# Patient Record
Sex: Female | Born: 1995
Health system: Southern US, Community
[De-identification: ages and names within clinical notes are randomized; demographics above are authoritative.]

## PROBLEM LIST (undated history)

## (undated) DIAGNOSIS — R519 Headache, unspecified: Secondary | ICD-10-CM

## (undated) DIAGNOSIS — F909 Attention-deficit hyperactivity disorder, unspecified type: Secondary | ICD-10-CM

## (undated) DIAGNOSIS — F419 Anxiety disorder, unspecified: Secondary | ICD-10-CM

## (undated) DIAGNOSIS — R51 Headache: Secondary | ICD-10-CM

## (undated) DIAGNOSIS — F32A Depression, unspecified: Secondary | ICD-10-CM

## (undated) DIAGNOSIS — F319 Bipolar disorder, unspecified: Secondary | ICD-10-CM

## (undated) DIAGNOSIS — F429 Obsessive-compulsive disorder, unspecified: Secondary | ICD-10-CM

## (undated) DIAGNOSIS — E785 Hyperlipidemia, unspecified: Secondary | ICD-10-CM

## (undated) DIAGNOSIS — F609 Personality disorder, unspecified: Secondary | ICD-10-CM

## (undated) DIAGNOSIS — K219 Gastro-esophageal reflux disease without esophagitis: Secondary | ICD-10-CM

## (undated) DIAGNOSIS — F329 Major depressive disorder, single episode, unspecified: Secondary | ICD-10-CM

## (undated) HISTORY — DX: Attention-deficit hyperactivity disorder, unspecified type: F90.9

## (undated) HISTORY — DX: Personality disorder, unspecified: F60.9

## (undated) HISTORY — DX: Hyperlipidemia, unspecified: E78.5

## (undated) HISTORY — DX: Anxiety disorder, unspecified: F41.9

---
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
## 2016-02-01 HISTORY — PX: CHOLECYSTECTOMY: SHX55

## 2016-02-08 ENCOUNTER — Emergency Department
Admission: EM | Admit: 2016-02-08 | Discharge: 2016-02-08 | Disposition: A | Discharge status: Home / Self Care | Payer: BLUE CROSS/BLUE SHIELD | Attending: Emergency Medicine | Admitting: Emergency Medicine

## 2016-02-08 ENCOUNTER — Emergency Department: Payer: BLUE CROSS/BLUE SHIELD

## 2016-02-08 ENCOUNTER — Encounter: Payer: Self-pay | Admitting: Emergency Medicine

## 2016-02-08 DIAGNOSIS — K8020 Calculus of gallbladder without cholecystitis without obstruction: Secondary | ICD-10-CM | POA: Diagnosis not present

## 2016-02-08 DIAGNOSIS — Z79899 Other long term (current) drug therapy: Secondary | ICD-10-CM | POA: Diagnosis not present

## 2016-02-08 DIAGNOSIS — R109 Unspecified abdominal pain: Secondary | ICD-10-CM | POA: Diagnosis present

## 2016-02-08 DIAGNOSIS — K8050 Calculus of bile duct without cholangitis or cholecystitis without obstruction: Secondary | ICD-10-CM

## 2016-02-08 DIAGNOSIS — R1011 Right upper quadrant pain: Secondary | ICD-10-CM

## 2016-02-08 DIAGNOSIS — Z3202 Encounter for pregnancy test, result negative: Secondary | ICD-10-CM | POA: Diagnosis not present

## 2016-02-08 HISTORY — DX: Bipolar disorder, unspecified: F31.9

## 2016-02-08 LAB — CBC
HCT: 42.3 % (ref 35.0–47.0)
HEMOGLOBIN: 14.4 g/dL (ref 12.0–16.0)
MCH: 30.2 pg (ref 26.0–34.0)
MCHC: 34 g/dL (ref 32.0–36.0)
MCV: 88.7 fL (ref 80.0–100.0)
Platelets: 322 10*3/uL (ref 150–440)
RBC: 4.76 MIL/uL (ref 3.80–5.20)
RDW: 13.1 % (ref 11.5–14.5)
WBC: 7.2 10*3/uL (ref 3.6–11.0)

## 2016-02-08 LAB — COMPREHENSIVE METABOLIC PANEL
ALT: 17 U/L (ref 14–54)
ANION GAP: 5 (ref 5–15)
AST: 15 U/L (ref 15–41)
Albumin: 4.2 g/dL (ref 3.5–5.0)
Alkaline Phosphatase: 89 U/L (ref 38–126)
BUN: 14 mg/dL (ref 6–20)
CHLORIDE: 103 mmol/L (ref 101–111)
CO2: 29 mmol/L (ref 22–32)
Calcium: 9.3 mg/dL (ref 8.9–10.3)
Creatinine, Ser: 0.91 mg/dL (ref 0.44–1.00)
GFR calc non Af Amer: >60 mL/min (ref >60)
Glucose, Bld: 101 mg/dL — ABNORMAL HIGH (ref 65–99)
Potassium: 4.1 mmol/L (ref 3.5–5.1)
SODIUM: 137 mmol/L (ref 135–145)
Total Bilirubin: 0.6 mg/dL (ref 0.3–1.2)
Total Protein: 7.2 g/dL (ref 6.5–8.1)

## 2016-02-08 LAB — LIPASE, BLOOD: Lipase: 18 U/L (ref 11–51)

## 2016-02-08 LAB — URINALYSIS COMPLETE WITH MICROSCOPIC (ARMC ONLY)
Bacteria, UA: NONE SEEN
Bilirubin Urine: NEGATIVE
Glucose, UA: NEGATIVE mg/dL
HGB URINE DIPSTICK: NEGATIVE
KETONES UR: NEGATIVE mg/dL
Leukocytes, UA: NEGATIVE
Nitrite: NEGATIVE
PROTEIN: NEGATIVE mg/dL
Specific Gravity, Urine: 1.025 (ref 1.005–1.030)
pH: 7 (ref 5.0–8.0)

## 2016-02-08 LAB — POCT PREGNANCY, URINE: Preg Test, Ur: NEGATIVE

## 2016-02-08 MED ORDER — OXYCODONE-ACETAMINOPHEN 5-325 MG PO TABS: 2.0000 | ORAL_TABLET | Freq: Four times a day (QID) | ORAL | Status: DC | PRN

## 2016-02-08 MED ORDER — FAMOTIDINE 20 MG PO TABS: 20.0000 mg | ORAL_TABLET | Freq: Every day | ORAL | Status: DC

## 2016-02-08 NOTE — ED Provider Notes (Signed)
IMPRESSION: Several large gallstones with associated gallbladder wall thickening, which may be due to incomplete distention of the gallbladder or in the appropriate clinical setting may represent an acute cholecystitis.  Normal caliber of the extrahepatic common bile duct.  Normal appearance of the liver.  Patient's symptoms are consistent with biliary colic. She will receive pain medication and Gen. surgery outpatient referral. She does not have laboratory findings consistent with cholecystitis. Her exam is benign at this time.  Emily Filbert, MD 02/08/16 [phone]

## 2016-02-08 NOTE — Discharge Instructions (Signed)
Biliary Colic °Biliary colic is a pain in the upper abdomen. The pain: °· Is usually felt on the right side of the abdomen, but it may also be felt in the center of the abdomen, just below the breastbone (sternum). °· May spread back toward the right shoulder blade. °· May be steady or irregular. °· May be accompanied by nausea and vomiting. °Most of the time, the pain goes away in 1-5 hours. After the most intense pain passes, the abdomen may continue to ache mildly for about 24 hours. °Biliary colic is caused by a blockage in the bile duct. The bile duct is a pathway that carries bile--a liquid that helps to digest fats--from the gallbladder to the small intestine. Biliary colic usually occurs after eating, when the digestive system demands bile. The pain develops when muscle cells contract forcefully to try to move the blockage so that bile can get by. °HOME CARE INSTRUCTIONS °· Take medicines only as directed by your health care provider. °· Drink enough fluid to keep your urine clear or pale yellow. °· Avoid fatty, greasy, and fried foods. These kinds of foods increase your body's demand for bile. °· Avoid any foods that make your pain worse. °· Avoid overeating. °· Avoid having a large meal after fasting. °SEEK MEDICAL CARE IF: °· You develop a fever. °· Your pain gets worse. °· You vomit. °· You develop nausea that prevents you from eating and drinking. °SEEK IMMEDIATE MEDICAL CARE IF: °· You suddenly develop a fever and shaking chills. °· You develop a yellowish discoloration (jaundice) of: °¨ Skin. °¨ Whites of the eyes. °¨ Mucous membranes. °· You have continuous or severe pain that is not relieved with medicines. °· You have nausea and vomiting that is not relieved with medicines. °· You develop dizziness or you faint. °  °This information is not intended to replace advice given to you by your health care provider. Make sure you discuss any questions you have with your health care provider. °  °Document  Released: 05/20/2006 Document Revised: 05/03/2015 Document Reviewed: 09/28/2014 °Elsevier Interactive Patient Education ©2016 [employer] ° °

## 2016-02-08 NOTE — ED Notes (Signed)
Pt presents to ED with mid abd aching. Pt states the discomfort "just sits there". She reports "it occurs all day so it gives different feelings depending on whats going on throughout the day". Worse in the morning and at night. Seen by urgent care last week after several days of vomiting and was prescribed Zofran with no relief. Pt denies diarrhea or fever currently.

## 2016-02-08 NOTE — ED Provider Notes (Signed)
[facility] Emergency Department Provider Note  ____________________________________________  Time seen: 6:20 AM   I have reviewed the triage vital signs and the nursing notes.   HISTORY  Chief Complaint Abdominal Pain    HPI Jessica MCCLARY is a 21 y.o. female presents with mid abdominal pain times approximately 6 months is worsen after eating. Patient states that pain is worse in the morning and at night. Patient denies any diarrhea but does admit to nausea and vomiting accompanying the abdominal pain. Patient states that she was seen by urgent care and prescribed Zofran however she does not know the etiology of her pain at this time. Patient also admits to a family history of gallstones in both her mother and grandmother.    Past Medical History  Diagnosis Date  . Depression   . Bipolar disorder (HCC)     There are no active problems to display for this patient.   History reviewed. No pertinent past surgical history.  Current Outpatient Rx  Name  Route  Sig  Dispense  Refill  . ABILIFY 30 MG tablet   Oral   Take 1 tablet by mouth daily.      0     Dispense as written.   . hydrOXYzine (ATARAX/VISTARIL) 25 MG tablet   Oral   Take 1 tablet by mouth 2 (two) times daily.      1   . ondansetron (ZOFRAN-ODT) 8 MG disintegrating tablet   Oral   Take 1 tablet by mouth every 8 (eight) hours as needed.      0   . TRI-PREVIFEM 0.18/0.215/0.25 MG-35 MCG tablet   Oral   Take 1 tablet by mouth daily.      11     Dispense as written.     Allergies No known allergies No family history on file.  Social History Social History  Substance Use Topics  . Smoking status: Never Smoker   . Smokeless tobacco: Never Used  . Alcohol Use: No    Review of Systems  Constitutional: Negative for fever. Eyes: Negative for visual changes. ENT: Negative for sore throat. Cardiovascular: Negative for chest pain. Respiratory: Negative for  shortness of breath. Gastrointestinal: Negative for abdominal pain, vomiting and diarrhea. Genitourinary: Negative for dysuria. Musculoskeletal: Negative for back pain. Skin: Negative for rash. Neurological: Negative for headaches, focal weakness or numbness.   10-point ROS otherwise negative.  ____________________________________________   PHYSICAL EXAM:  VITAL SIGNS: ED Triage Vitals  Enc Vitals Group     BP 02/08/16 0348 138/69 mmHg     Pulse Rate 02/08/16 0348 69     Resp 02/08/16 0348 18     Temp 02/08/16 0348 98 F (36.7 C)     Temp Source 02/08/16 0348 Oral     SpO2 02/08/16 0348 99 %     Weight 02/08/16 0348 155 lb (70.308 kg)     Height 02/08/16 0348  (1.651 m)     Head Cir --      Peak Flow --      Pain Score 02/08/16 0412 6     Pain Loc --      Pain Edu? --      Excl. in GC? --      Constitutional: Alert and oriented. Well appearing and in no distress. Eyes: Conjunctivae are normal. PERRL. Normal extraocular movements. ENT   Head: Normocephalic and atraumatic.   Nose: No congestion/rhinnorhea.   Mouth/Throat: Mucous membranes are moist.   Neck: No  stridor. Hematological/Lymphatic/Immunilogical: No cervical lymphadenopathy. Cardiovascular: Normal rate, regular rhythm. Normal and symmetric distal pulses are present in all extremities. No murmurs, rubs, or gallops. Respiratory: Normal respiratory effort without tachypnea nor retractions. Breath sounds are clear and equal bilaterally. No wheezes/rales/rhonchi. Gastrointestinal: Right upper quadrant tenderness to palpation. No distention. There is no CVA tenderness. Genitourinary: deferred Musculoskeletal: Nontender with normal range of motion in all extremities. No joint effusions.  No lower extremity tenderness nor edema. Neurologic:  Normal speech and language. No gross focal neurologic deficits are appreciated. Speech is normal.  Skin:  Skin is warm, dry and intact. No rash  noted. Psychiatric: Mood and affect are normal. Speech and behavior are normal. Patient exhibits appropriate insight and judgment.  ____________________________________________    LABS (pertinent positives/negatives)  Labs Reviewed  COMPREHENSIVE METABOLIC PANEL - Abnormal; Notable for the following:    Glucose, Bld 101 (*)    All other components within normal limits  URINALYSIS COMPLETEWITH MICROSCOPIC ([facility] ONLY) - Abnormal; Notable for the following:    Color, Urine YELLOW (*)    APPearance CLEAR (*)    Squamous Epithelial / LPF 0-5 (*)    All other components within normal limits  LIPASE, BLOOD  CBC  POCT PREGNANCY, URINE      RADIOLOGY  US Abdomen Limited RUQ (Final result) Result time: 02/08/16 08:15:43   Final result by Rad Results In Interface (02/08/16 08:15:43)   Narrative:   CLINICAL DATA: Right upper quadrant pain for 2 months.  EXAM: US ABDOMEN LIMITED - RIGHT UPPER QUADRANT  COMPARISON: None.  FINDINGS: Gallbladder:  The gallbladder is incompletely distended. There are multiple large mobile gallstones, the largest of which measures 1.9 cm in long-axis. There is an associated gallbladder wall thickening, measuring up to 5.3 mm. No pericholecystic fluid is seen. Sonographic Murphy's sign was reported as negative.  Common bile duct:  Diameter: Normal, measuring 3.1 to 5 mm.  Liver:  No focal lesion identified. Within normal limits in parenchymal echogenicity.  IMPRESSION: Several large gallstones with associated gallbladder wall thickening, which may be due to incomplete distention of the gallbladder or in the appropriate clinical setting may represent an acute cholecystitis.  Normal caliber of the extrahepatic common bile duct.  Normal appearance of the liver.   Electronically Signed By: Ted Mcalpine M.D. On: 02/08/2016 08:15         INITIAL IMPRESSION / ASSESSMENT AND PLAN / ED COURSE  Pertinent labs & imaging  results that were available during my care of the patient were reviewed by me and considered in my medical decision making (see chart for details).  History of physical exam concern for possible cholelithiasis versus cholecystitis as such ultrasound ordered patient care transferred to Dr. Mayford Knife  ____________________________________________   FINAL CLINICAL IMPRESSION(S) / ED DIAGNOSES  Final diagnoses:  RUQ pain  Biliary colic      Darci Current, MD 02/10/16 [phone]

## 2016-02-13 ENCOUNTER — Encounter: Payer: Self-pay | Admitting: Emergency Medicine

## 2016-02-13 ENCOUNTER — Emergency Department
Admission: EM | Admit: 2016-02-13 | Discharge: 2016-02-13 | Disposition: A | Discharge status: Home / Self Care | Payer: BLUE CROSS/BLUE SHIELD | Attending: Emergency Medicine | Admitting: Emergency Medicine

## 2016-02-13 DIAGNOSIS — Z79899 Other long term (current) drug therapy: Secondary | ICD-10-CM | POA: Insufficient documentation

## 2016-02-13 DIAGNOSIS — Z760 Encounter for issue of repeat prescription: Secondary | ICD-10-CM | POA: Insufficient documentation

## 2016-02-13 DIAGNOSIS — R1030 Lower abdominal pain, unspecified: Secondary | ICD-10-CM | POA: Diagnosis not present

## 2016-02-13 MED ORDER — OXYCODONE-ACETAMINOPHEN 5-325 MG PO TABS
1.0000 | ORAL_TABLET | ORAL | Status: DC | PRN
Start: 2016-02-13 — End: 2016-02-16

## 2016-02-13 NOTE — ED Notes (Signed)
Pt reports dx with gallstones last wk. Still having vomiting when she eats.  Pt refused more labs, states she has a f/u apt on 2/16 and was told if she needed more meds before her apt to return here.

## 2016-02-13 NOTE — ED Provider Notes (Signed)
[facility] Emergency Department Provider Note  ____________________________________________  Time seen: Approximately 11:24 AM  I have reviewed the triage vital signs and the nursing notes.   HISTORY  Chief Complaint Medication refill   HPI Jessica Gates is a 21 y.o. female was seen in the ED 02/08/16 for emesis and diagnosed with gallstones. She presents today regarding medication refill to have until cholecystectomy on February 16th. She is currently taking Pepcid 20 mg 1 tab po qd and percocet 5-325 mg 2 tabs po every 6 hours as needed. Since her visit on the 8th she has not had any changes in symptoms although she has not been able to eat for the past 2 days. She complains of lower abdominal aching that slightly radiates laterally. Pain is worse with eating and worse in the morning and at night. She ranks her pain a 6-8/10.   Past Medical History  Diagnosis Date  . Depression   . Bipolar disorder (HCC)     There are no active problems to display for this patient.   History reviewed. No pertinent past surgical history.  Current Outpatient Rx  Name  Route  Sig  Dispense  Refill  . ABILIFY 30 MG tablet   Oral   Take 1 tablet by mouth daily.      0     Dispense as written.   . famotidine (PEPCID) 20 MG tablet   Oral   Take 1 tablet (20 mg total) by mouth daily.   30 tablet   1   . hydrOXYzine (ATARAX/VISTARIL) 25 MG tablet   Oral   Take 1 tablet by mouth 2 (two) times daily.      1   . ondansetron (ZOFRAN-ODT) 8 MG disintegrating tablet   Oral   Take 1 tablet by mouth every 8 (eight) hours as needed.      0   . oxyCODONE-acetaminophen (PERCOCET) 5-325 MG tablet   Oral   Take 2 tablets by mouth every 6 (six) hours as needed for moderate pain or severe pain.   30 tablet   0   . oxyCODONE-acetaminophen (ROXICET) 5-325 MG tablet   Oral   Take 1 tablet by mouth every 4 (four) hours as needed for severe pain.   30 tablet   0   .  TRI-PREVIFEM 0.18/0.215/0.25 MG-35 MCG tablet   Oral   Take 1 tablet by mouth daily.      11     Dispense as written.     Allergies Review of patient's allergies indicates no known allergies.  History reviewed. No pertinent family history.  Social History Social History  Substance Use Topics  . Smoking status: Never Smoker   . Smokeless tobacco: Never Used  . Alcohol Use: No    Review of Systems Constitutional: No fever/chills Gastrointestinal: See above  10-point ROS otherwise negative.  ____________________________________________   PHYSICAL EXAM:  VITAL SIGNS: ED Triage Vitals  Enc Vitals Group     BP 02/13/16 1038 131/86 mmHg     Pulse Rate 02/13/16 1038 84     Resp 02/13/16 1038 18     Temp 02/13/16 1038 98.2 F (36.8 C)     Temp Source 02/13/16 1038 Oral     SpO2 02/13/16 1038 98 %     Weight 02/13/16 1038 165 lb (74.844 kg)     Height 02/13/16 1038  (1.651 m)     Head Cir --      Peak Flow --  Pain Score 02/13/16 1039 5     Pain Loc --      Pain Edu? --      Excl. in GC? --     Constitutional: Alert and oriented. Well appearing and in no acute distress. Eyes: Conjunctivae are normal.  Head: Atraumatic. Hematological/Lymphatic/Immunilogical: No cervical lymphadenopathy. Cardiovascular: Normal rate, regular rhythm. Grossly normal heart sounds.  Respiratory: Normal respiratory effort.  No retractions. Lungs CTAB. Gastrointestinal: Soft and tender in RLQ, LUQ and LLQ. No distention. No abdominal bruits. Neurologic:  Normal speech and language. No gross focal neurologic deficits are appreciated. No gait instability. Skin:  Skin is warm, dry and intact. No rash noted. Psychiatric: Mood and affect are normal. Speech and behavior are normal.  ____________________________________________   LABS (all labs ordered are listed, but only abnormal results are displayed)  Labs Reviewed - No data to  display ____________________________________________  EKG   ____________________________________________  RADIOLOGY   ____________________________________________   PROCEDURES  Procedure(s) performed: None  Critical Care performed: No  ____________________________________________   INITIAL IMPRESSION / ASSESSMENT AND PLAN / ED COURSE  Pertinent labs & imaging results that were available during my care of the patient were reviewed by me and considered in my medical decision making (see chart for details).  Medication refill for cholelithiasis. Is pending surgery in 3 days. Patient prescription for Percocet for 3 days. ____________________________________________   FINAL CLINICAL IMPRESSION(S) / ED DIAGNOSES  Final diagnoses:  Encounter for medication refill      Joni Reining, PA-C 02/13/16 1142  Rockne Menghini, MD 02/13/16 1528

## 2016-02-13 NOTE — Discharge Instructions (Signed)
Medicine Refill at the Emergency Department  We have refilled your medicine today, but it is best for you to get refills through your primary health care provider's office. In the future, please plan ahead so you do not need to get refills from the emergency department.  If the medicine we refilled was a maintenance medicine, you may have received only enough to get you by until you are able to see your regular health care provider.     This information is not intended to replace advice given to you by your health care provider. Make sure you discuss any questions you have with your health care provider.     Document Released: 04/04/2004 Document Revised: 01/07/2015 Document Reviewed: 03/26/2014  Elsevier Interactive Patient Education 2016 [employer]

## 2016-02-15 ENCOUNTER — Encounter: Payer: Self-pay | Admitting: General Surgery

## 2016-02-16 ENCOUNTER — Ambulatory Visit (INDEPENDENT_AMBULATORY_CARE_PROVIDER_SITE_OTHER): Payer: BLUE CROSS/BLUE SHIELD | Admitting: General Surgery

## 2016-02-16 ENCOUNTER — Encounter: Payer: Self-pay | Admitting: General Surgery

## 2016-02-16 VITALS — BP 110/76 | HR 69 | Temp 97.8°F | Ht 65.0 in | Wt 163.0 lb

## 2016-02-16 DIAGNOSIS — K8020 Calculus of gallbladder without cholecystitis without obstruction: Secondary | ICD-10-CM | POA: Insufficient documentation

## 2016-02-16 MED ORDER — OXYCODONE-ACETAMINOPHEN 5-325 MG PO TABS: 1.0000 | ORAL_TABLET | ORAL | Status: DC | PRN

## 2016-02-16 NOTE — Progress Notes (Signed)
Patient ID: Jessica Gates, female   DOB: 1995/01/19, 21 y.o.   MRN: [id number]  CC: ABDOMINAL PAIN  HPI Jessica Gates is a 21 y.o. female  Presents to clinic for evaluation of right sided abdominal pain. Patient is been seen in the emergency department twice for this or the last month. Patient states that the pain is worse in the mornings and evenings but is worsened by eating. It doesn't matter what she eats it worsens her pain. She was seen in emergency department and found to have cholelithiasis without cholecystitis. Her labs were all normal and she was discharged home with outpatient follow-up with [place] and pain medications. Patient states she has had some nausea and vomiting with this but none in the last several days. She also had diarrhea at the onset of the pain but none in the last week. She denies any fevers, chills, chest pain, shortness of breath. She does state that they oral pain medications while reviewed through her day while she is awaiting surgery.  HPI  Past Medical History  Diagnosis Date  . Depression   . Bipolar disorder (HCC)   . Anxiety   . Personality disorder   . ADHD (attention deficit hyperactivity disorder)     History reviewed. No pertinent past surgical history.  Family History  Problem Relation Age of Onset  . Cholelithiasis Mother   . Cancer Mother 30    kidney  . Mental illness Mother   . Appendicitis Father   . Cancer Maternal Grandfather     lung and throat  . Diabetes Paternal Grandmother     Social History Social History  Substance Use Topics  . Smoking status: Never Smoker   . Smokeless tobacco: Never Used  . Alcohol Use: No    No Known Allergies  Current Outpatient Prescriptions  Medication Sig Dispense Refill  . ABILIFY 30 MG tablet Take 1 tablet by mouth daily.  0  . famotidine (PEPCID) 20 MG tablet Take 1 tablet (20 mg total) by mouth daily. 30 tablet 1  . HYDROcodone-acetaminophen (NORCO/VICODIN) 5-325 MG tablet Take 1  tablet by mouth every 6 (six) hours as needed for moderate pain.    . hydrOXYzine (ATARAX/VISTARIL) 25 MG tablet Take 1 tablet by mouth 2 (two) times daily.  1  . ondansetron (ZOFRAN-ODT) 8 MG disintegrating tablet Take 1 tablet by mouth every 8 (eight) hours as needed.  0  . TRI-PREVIFEM 0.18/0.215/0.25 MG-35 MCG tablet Take 1 tablet by mouth daily.  11  . oxyCODONE-acetaminophen (ROXICET) 5-325 MG tablet Take 1 tablet by mouth every 4 (four) hours as needed for severe pain. 20 tablet 0   No current facility-administered medications for this visit.     Review of Systems A  Multi-point review of systems was asked and was negative except for the  Findings documented in the history of present illness  Physical Exam Blood pressure 110/76, pulse 69, temperature 97.8 F (36.6 C), temperature source Oral, height  (1.651 m), weight 73.936 kg (163 lb), last menstrual period 01/23/2016. CONSTITUTIONAL:  No acute distress. EYES: Pupils are equal, round, and reactive to light, Sclera are non-icteric. EARS, NOSE, MOUTH AND THROAT: The oropharynx is clear. The oral mucosa is pink and moist. Hearing is intact to voice. LYMPH NODES:  Lymph nodes in the neck are normal. RESPIRATORY:  Lungs are clear. There is normal respiratory effort, with equal breath sounds bilaterally, and without pathologic use of accessory muscles. CARDIOVASCULAR: Heart is regular without murmurs, gallops, or  rubs. GI: The abdomen is soft,  Minimally tender in the right upper quadrant without any rebound or Murphy sign, and nondistended. There are no palpable masses. There is no hepatosplenomegaly. There are normal bowel sounds in all quadrants. GU: Rectal deferred.   MUSCULOSKELETAL: Normal muscle strength and tone. No cyanosis or edema.   SKIN: Turgor is good and there are no pathologic skin lesions or ulcers. NEUROLOGIC: Motor and sensation is grossly normal. Cranial nerves are grossly intact. PSYCH:  Oriented to person,  place and time. Affect is normal.  Data Reviewed  images and labs reviewed. Ultrasound does show gallstones with possible gallbladder wall thickening but without any prior cholecystic fluid or any ductal dilatation. All of her labs are within normal limits. I have personally reviewed the patient's imaging, laboratory findings and medical records.    Assessment     symptomatic cholelithiasis     Plan     21 year old female with symptomatic cholelithiasis. I discussed the procedure in detail.  The patient was given Agricultural engineer.  We discussed the risks and benefits of a laparoscopic cholecystectomy and possible cholangiogram including, but not limited to bleeding, infection, injury to surrounding structures such as the intestine or liver, bile leak, retained gallstones, need to convert to an open procedure, prolonged diarrhea, blood clots such as  DVT, common bile duct injury, anesthesia risks, and possible need for additional procedures.  The likelihood of improvement in symptoms and return to the patient's normal status is good. We discussed the typical post-operative recovery course.    The patient and her mother both voiced understanding and desire for surgery soon as possible. Discussed that the earliest possible operative dates her with one of my partners and would be next week.  Plan for surgery next week with either Dr. Excell Seltzer or Dr. Everlene Farrier depending on availability.      Time spent with the patient was 45 minutes, with more than 50% of the time spent in face-to-face education, counseling and care coordination.     Ricarda Frame, MD FACS General Surgeon 02/16/2016, 2:27 PM

## 2016-02-17 ENCOUNTER — Telehealth: Payer: Self-pay | Admitting: Surgery

## 2016-02-17 NOTE — Telephone Encounter (Signed)
Patience called back and I advised her of her surgery information.

## 2016-02-17 NOTE — Telephone Encounter (Signed)
I have called Pt to advise her of pre op date/time and sx date, no answer. i have left a detailed message.  Sx: 02/22/16 with Dr Ludwig Clarks cholecystectomy.  Pre op: 02/20/16 between 1-5pm--Phone pre op.

## 2016-02-20 ENCOUNTER — Encounter: Payer: Self-pay | Admitting: *Deleted

## 2016-02-20 ENCOUNTER — Emergency Department
Admission: EM | Admit: 2016-02-20 | Discharge: 2016-02-20 | Disposition: A | Discharge status: Home / Self Care | Payer: BLUE CROSS/BLUE SHIELD | Attending: Emergency Medicine | Admitting: Emergency Medicine

## 2016-02-20 ENCOUNTER — Encounter: Payer: Self-pay | Admitting: Emergency Medicine

## 2016-02-20 ENCOUNTER — Other Ambulatory Visit: Payer: BLUE CROSS/BLUE SHIELD

## 2016-02-20 DIAGNOSIS — K8020 Calculus of gallbladder without cholecystitis without obstruction: Secondary | ICD-10-CM | POA: Diagnosis not present

## 2016-02-20 DIAGNOSIS — R1031 Right lower quadrant pain: Secondary | ICD-10-CM | POA: Diagnosis present

## 2016-02-20 LAB — CBC
HEMATOCRIT: 40.5 % (ref 35.0–47.0)
Hemoglobin: 13.5 g/dL (ref 12.0–16.0)
MCH: 29.7 pg (ref 26.0–34.0)
MCHC: 33.4 g/dL (ref 32.0–36.0)
MCV: 88.8 fL (ref 80.0–100.0)
Platelets: 207 10*3/uL (ref 150–440)
RBC: 4.56 MIL/uL (ref 3.80–5.20)
RDW: 13.1 % (ref 11.5–14.5)
WBC: 7.3 10*3/uL (ref 3.6–11.0)

## 2016-02-20 LAB — LIPASE, BLOOD: Lipase: 17 U/L (ref 11–51)

## 2016-02-20 LAB — COMPREHENSIVE METABOLIC PANEL
ALT: 18 U/L (ref 14–54)
ANION GAP: 6 (ref 5–15)
AST: 18 U/L (ref 15–41)
Albumin: 4.5 g/dL (ref 3.5–5.0)
Alkaline Phosphatase: 80 U/L (ref 38–126)
BILIRUBIN TOTAL: 0.5 mg/dL (ref 0.3–1.2)
BUN: 11 mg/dL (ref 6–20)
CO2: 26 mmol/L (ref 22–32)
Calcium: 9.3 mg/dL (ref 8.9–10.3)
Chloride: 107 mmol/L (ref 101–111)
Creatinine, Ser: 0.58 mg/dL (ref 0.44–1.00)
GFR calc Af Amer: >60 mL/min (ref >60)
Glucose, Bld: 84 mg/dL (ref 65–99)
POTASSIUM: 3.4 mmol/L — AB (ref 3.5–5.1)
Sodium: 139 mmol/L (ref 135–145)
TOTAL PROTEIN: 7.1 g/dL (ref 6.5–8.1)

## 2016-02-20 NOTE — Patient Instructions (Signed)
  Your procedure is scheduled on: 02-22-16 Report to [facility] 2ND FLOOR To find out your arrival time please call [phone] between 1PM - 3PM on 02-21-16  Remember: Instructions that are not followed completely may result in serious medical risk, up to and including death, or upon the discretion of your surgeon and anesthesiologist your surgery may need to be rescheduled.    _X___ 1. Do not eat food or drink liquids after midnight. No gum chewing or hard candies.     _X___ 2. No Alcohol for 24 hours before or after surgery.   ____ 3. Bring all medications with you on the day of surgery if instructed.    ____ 4. Notify your doctor if there is any change in your medical condition     (cold, fever, infections).     Do not wear jewelry, make-up, hairpins, clips or nail polish.  Do not wear lotions, powders, or perfumes. You may wear deodorant.  Do not shave 48 hours prior to surgery. Men may shave face and neck.  Do not bring valuables to the hospital.    [facility] is not responsible for any belongings or valuables.               Contacts, dentures or bridgework may not be worn into surgery.  Leave your suitcase in the car. After surgery it may be brought to your room.  For patients admitted to the hospital, discharge time is determined by your treatment team.   Patients discharged the day of surgery will not be allowed to drive home.   Please read over the following fact sheets that you were given:      _X___ Take these medicines the morning of surgery with A SIP OF WATER:    1. ABILIFY  2.  PEPCID  3. TAKE AN EXTRA PEPCID ON Tuesday NIGHT BEFORE BED  4.  5.  6.  ____ Fleet Enema (as directed)   ____ Use CHG Soap as directed  ____ Use inhalers on the day of surgery  ____ Stop metformin 2 days prior to surgery    ____ Take 1/2 of usual insulin dose the night before surgery and none on the morning of surgery.   ____ Stop  Coumadin/Plavix/aspirin-N/A  ____ Stop Anti-inflammatories-NO NSAIDS OR ASA PRODUCTS-PERCOCET OK TO CONTINUE   ____ Stop supplements until after surgery.    ____ Bring C-Pap to the hospital.

## 2016-02-20 NOTE — ED Notes (Signed)
Pt arrived to the ED accompanied by her mother for complaints of RLQ abdominal pain secondary to gall stones. Pt states that she has been seen in the ED 3 times for the same and has surgery scheduled for 02/22/16 and was told by the surgeon that if she was hurting to come to the ED to do surgery. Pt is AOx4 in no apparent distress.

## 2016-02-22 ENCOUNTER — Ambulatory Visit
Admission: RE | Admit: 2016-02-22 | Discharge: 2016-02-22 | Disposition: A | Discharge status: Home / Self Care | Payer: BLUE CROSS/BLUE SHIELD | Source: Ambulatory Visit | Attending: Surgery | Admitting: Surgery

## 2016-02-22 ENCOUNTER — Encounter
Admission: RE | Disposition: A | Discharge status: Home / Self Care | Payer: Self-pay | Source: Ambulatory Visit | Attending: Surgery

## 2016-02-22 ENCOUNTER — Ambulatory Visit: Payer: BLUE CROSS/BLUE SHIELD | Admitting: Certified Registered"

## 2016-02-22 ENCOUNTER — Encounter: Payer: Self-pay | Admitting: *Deleted

## 2016-02-22 DIAGNOSIS — Z79899 Other long term (current) drug therapy: Secondary | ICD-10-CM | POA: Diagnosis not present

## 2016-02-22 DIAGNOSIS — F319 Bipolar disorder, unspecified: Secondary | ICD-10-CM | POA: Diagnosis not present

## 2016-02-22 DIAGNOSIS — Z8051 Family history of malignant neoplasm of kidney: Secondary | ICD-10-CM | POA: Insufficient documentation

## 2016-02-22 DIAGNOSIS — F419 Anxiety disorder, unspecified: Secondary | ICD-10-CM | POA: Insufficient documentation

## 2016-02-22 DIAGNOSIS — K8050 Calculus of bile duct without cholangitis or cholecystitis without obstruction: Secondary | ICD-10-CM | POA: Insufficient documentation

## 2016-02-22 DIAGNOSIS — F909 Attention-deficit hyperactivity disorder, unspecified type: Secondary | ICD-10-CM | POA: Diagnosis not present

## 2016-02-22 DIAGNOSIS — Z801 Family history of malignant neoplasm of trachea, bronchus and lung: Secondary | ICD-10-CM | POA: Insufficient documentation

## 2016-02-22 DIAGNOSIS — K8010 Calculus of gallbladder with chronic cholecystitis without obstruction: Secondary | ICD-10-CM | POA: Diagnosis not present

## 2016-02-22 DIAGNOSIS — Z833 Family history of diabetes mellitus: Secondary | ICD-10-CM | POA: Insufficient documentation

## 2016-02-22 DIAGNOSIS — Z800 Family history of malignant neoplasm of digestive organs: Secondary | ICD-10-CM | POA: Diagnosis not present

## 2016-02-22 DIAGNOSIS — F329 Major depressive disorder, single episode, unspecified: Secondary | ICD-10-CM | POA: Diagnosis not present

## 2016-02-22 DIAGNOSIS — F609 Personality disorder, unspecified: Secondary | ICD-10-CM | POA: Diagnosis not present

## 2016-02-22 DIAGNOSIS — G43909 Migraine, unspecified, not intractable, without status migrainosus: Secondary | ICD-10-CM | POA: Insufficient documentation

## 2016-02-22 DIAGNOSIS — Z818 Family history of other mental and behavioral disorders: Secondary | ICD-10-CM | POA: Insufficient documentation

## 2016-02-22 DIAGNOSIS — Z8379 Family history of other diseases of the digestive system: Secondary | ICD-10-CM | POA: Diagnosis not present

## 2016-02-22 DIAGNOSIS — K219 Gastro-esophageal reflux disease without esophagitis: Secondary | ICD-10-CM | POA: Diagnosis not present

## 2016-02-22 HISTORY — DX: Gastro-esophageal reflux disease without esophagitis: K21.9

## 2016-02-22 HISTORY — DX: Headache: R51

## 2016-02-22 HISTORY — DX: Headache, unspecified: R51.9

## 2016-02-22 HISTORY — PX: CHOLECYSTECTOMY: SHX55

## 2016-02-22 LAB — POCT PREGNANCY, URINE: PREG TEST UR: NEGATIVE

## 2016-02-22 SURGERY — LAPAROSCOPIC CHOLECYSTECTOMY WITH INTRAOPERATIVE CHOLANGIOGRAM
Anesthesia: General

## 2016-02-22 MED ORDER — FENTANYL CITRATE (PF) 100 MCG/2ML IJ SOLN
INTRAMUSCULAR | Status: AC
  Administered 2016-02-22: 25 ug via INTRAVENOUS
  Filled 2016-02-22: qty 2

## 2016-02-22 MED ORDER — LACTATED RINGERS IV SOLN
INTRAVENOUS | Status: DC
  Administered 2016-02-22: 10:00:00 via INTRAVENOUS

## 2016-02-22 MED ORDER — OXYCODONE-ACETAMINOPHEN 5-325 MG PO TABS
1.0000 | ORAL_TABLET | Freq: Once | ORAL | Status: AC
  Administered 2016-02-22: 1 via ORAL

## 2016-02-22 MED ORDER — PROPOFOL 10 MG/ML IV BOLUS
INTRAVENOUS | Status: DC | PRN
  Administered 2016-02-22: 200 mg via INTRAVENOUS

## 2016-02-22 MED ORDER — GLYCOPYRROLATE 0.2 MG/ML IJ SOLN
INTRAMUSCULAR | Status: DC | PRN
  Administered 2016-02-22: 0.6 mg via INTRAVENOUS

## 2016-02-22 MED ORDER — ROCURONIUM BROMIDE 100 MG/10ML IV SOLN
INTRAVENOUS | Status: DC | PRN
  Administered 2016-02-22: 35 mg via INTRAVENOUS

## 2016-02-22 MED ORDER — FENTANYL CITRATE (PF) 100 MCG/2ML IJ SOLN
INTRAMUSCULAR | Status: DC | PRN
  Administered 2016-02-22 (×2): 50 ug via INTRAVENOUS
  Administered 2016-02-22: 100 ug via INTRAVENOUS
  Administered 2016-02-22: 50 ug via INTRAVENOUS

## 2016-02-22 MED ORDER — LIDOCAINE HCL (CARDIAC) 20 MG/ML IV SOLN
INTRAVENOUS | Status: DC | PRN
  Administered 2016-02-22: 50 mg via INTRAVENOUS

## 2016-02-22 MED ORDER — DEXAMETHASONE SODIUM PHOSPHATE 4 MG/ML IJ SOLN
INTRAMUSCULAR | Status: DC | PRN
  Administered 2016-02-22: 5 mg via INTRAVENOUS

## 2016-02-22 MED ORDER — PROMETHAZINE HCL 25 MG/ML IJ SOLN: 6.2500 mg | INTRAMUSCULAR | Status: DC | PRN

## 2016-02-22 MED ORDER — BUPIVACAINE-EPINEPHRINE (PF) 0.25% -1:200000 IJ SOLN
INTRAMUSCULAR | Status: AC
  Filled 2016-02-22: qty 30

## 2016-02-22 MED ORDER — CHLORHEXIDINE GLUCONATE 4 % EX LIQD: 1.0000 "application " | Freq: Once | CUTANEOUS | Status: DC

## 2016-02-22 MED ORDER — MIDAZOLAM HCL 2 MG/2ML IJ SOLN
INTRAMUSCULAR | Status: DC | PRN
Start: 2016-02-22 — End: 2016-02-22
  Administered 2016-02-22: 2 mg via INTRAVENOUS

## 2016-02-22 MED ORDER — BUPIVACAINE-EPINEPHRINE (PF) 0.25% -1:200000 IJ SOLN
INTRAMUSCULAR | Status: DC | PRN
  Administered 2016-02-22: 30 mL via PERINEURAL

## 2016-02-22 MED ORDER — NEOSTIGMINE METHYLSULFATE 10 MG/10ML IV SOLN
INTRAVENOUS | Status: DC | PRN
  Administered 2016-02-22: 4 mg via INTRAVENOUS

## 2016-02-22 MED ORDER — ONDANSETRON HCL 4 MG/2ML IJ SOLN
INTRAMUSCULAR | Status: DC | PRN
  Administered 2016-02-22: 4 mg via INTRAVENOUS

## 2016-02-22 MED ORDER — CEFOXITIN SODIUM 1 G IV SOLR
1.0000 g | INTRAVENOUS | Status: DC
  Filled 2016-02-22: qty 1

## 2016-02-22 MED ORDER — OXYCODONE-ACETAMINOPHEN 5-325 MG PO TABS
ORAL_TABLET | ORAL | Status: AC
Start: 2016-02-22 — End: 2016-02-22
  Administered 2016-02-22: 1 via ORAL
  Filled 2016-02-22: qty 1

## 2016-02-22 MED ORDER — FENTANYL CITRATE (PF) 100 MCG/2ML IJ SOLN
25.0000 ug | INTRAMUSCULAR | Status: AC | PRN
  Administered 2016-02-22 (×6): 25 ug via INTRAVENOUS

## 2016-02-22 SURGICAL SUPPLY — 43 items
ADHESIVE MASTISOL STRL (MISCELLANEOUS) ×3 IMPLANT
APPLIER CLIP ROT 10 11.4 M/L (STAPLE) ×3
BLADE SURG SZ11 CARB STEEL (BLADE) ×3 IMPLANT
CANISTER SUCT 1200ML W/VALVE (MISCELLANEOUS) ×3 IMPLANT
CATH CHOLANGI 4FR 420404F (CATHETERS) IMPLANT
CHLORAPREP W/TINT 26ML (MISCELLANEOUS) ×3 IMPLANT
CLIP APPLIE ROT 10 11.4 M/L (STAPLE) ×1 IMPLANT
CLOSURE WOUND 1/2 X4 (GAUZE/BANDAGES/DRESSINGS) ×1
CONRAY 60ML FOR OR (MISCELLANEOUS) IMPLANT
DRAPE C-ARM XRAY 36X54 (DRAPES) IMPLANT
ELECT REM PT RETURN 9FT ADLT (ELECTROSURGICAL) ×3
ELECTRODE REM PT RTRN 9FT ADLT (ELECTROSURGICAL) ×1 IMPLANT
ENDOPOUCH RETRIEVER 10 (MISCELLANEOUS) ×3 IMPLANT
GAUZE SPONGE NON-WVN 2X2 STRL (MISCELLANEOUS) ×3 IMPLANT
GLOVE BIO SURGEON STRL SZ8 (GLOVE) ×18 IMPLANT
GOWN STRL REUS W/ TWL LRG LVL3 (GOWN DISPOSABLE) ×3 IMPLANT
GOWN STRL REUS W/TWL LRG LVL3 (GOWN DISPOSABLE) ×6
IRRIGATION STRYKERFLOW (MISCELLANEOUS) ×1 IMPLANT
IRRIGATOR STRYKERFLOW (MISCELLANEOUS) ×3
IV CATH ANGIO 12GX3 LT BLUE (NEEDLE) IMPLANT
IV NS 1000ML (IV SOLUTION) ×2
IV NS 1000ML BAXH (IV SOLUTION) ×1 IMPLANT
JACKSON PRATT 10 (INSTRUMENTS) IMPLANT
KIT RM TURNOVER STRD PROC AR (KITS) ×3 IMPLANT
LABEL OR SOLS (LABEL) ×3 IMPLANT
NDL SAFETY 22GX1.5 (NEEDLE) ×3 IMPLANT
NEEDLE VERESS 14GA 120MM (NEEDLE) ×3 IMPLANT
NS IRRIG 500ML POUR BTL (IV SOLUTION) ×3 IMPLANT
PACK LAP CHOLECYSTECTOMY (MISCELLANEOUS) ×3 IMPLANT
SCISSORS METZENBAUM CVD 33 (INSTRUMENTS) ×3 IMPLANT
SLEEVE ENDOPATH XCEL 5M (ENDOMECHANICALS) ×6 IMPLANT
SPONGE EXCIL AMD DRAIN 4X4 6P (MISCELLANEOUS) IMPLANT
SPONGE LAP 18X18 5 PK (GAUZE/BANDAGES/DRESSINGS) ×3 IMPLANT
SPONGE VERSALON 2X2 STRL (MISCELLANEOUS) ×6
STRIP CLOSURE SKIN 1/2X4 (GAUZE/BANDAGES/DRESSINGS) ×2 IMPLANT
SUT MNCRL 4-0 (SUTURE) ×4
SUT MNCRL 4-0 27XMFL (SUTURE) ×2
SUT VICRYL 0 AB UR-6 (SUTURE) ×3 IMPLANT
SUTURE MNCRL 4-0 27XMF (SUTURE) ×2 IMPLANT
SYR 20CC LL (SYRINGE) ×3 IMPLANT
TROCAR XCEL NON-BLD 11X100MML (ENDOMECHANICALS) ×3 IMPLANT
TROCAR XCEL NON-BLD 5MMX100MML (ENDOMECHANICALS) ×3 IMPLANT
TUBING INSUFFLATOR HI FLOW (MISCELLANEOUS) ×3 IMPLANT

## 2016-02-22 NOTE — Transfer of Care (Signed)
Immediate Anesthesia Transfer of Care Note  Patient: Jessica Gates  Procedure(s) Performed: Procedure(s): LAPAROSCOPIC CHOLECYSTECTOMY WITH INTRAOPERATIVE CHOLANGIOGRAM (N/A)  Patient Location: PACU  Anesthesia Type:General  Level of Consciousness: sedated  Airway & Oxygen Therapy: Patient Spontanous Breathing and Patient connected to face mask oxygen  Post-op Assessment: Report given to RN  Post vital signs: Reviewed  Last Vitals:  Filed Vitals:   02/22/16 0908 02/22/16 1101  BP: 123/66 130/84  Pulse: 81   Temp: 36.6 C 36.1 C  Resp: 18 13    Complications: No apparent anesthesia complications

## 2016-02-22 NOTE — Progress Notes (Signed)
Preoperative Review   Patient is met in the preoperative holding area. The history is reviewed in the chart and with the patient. I personally reviewed the options and rationale as well as the risks of this procedure that have been previously discussed with the patient. All questions asked by the patient and/or family were answered to their satisfaction.  I personally reviewed the patient's chart and discussed her with Dr. Adonis Huguenin and reviewed the risks in detail myself. No family was present  Patient agrees to proceed with this procedure at this time.  Florene Glen M.D. FACS

## 2016-02-22 NOTE — Anesthesia Postprocedure Evaluation (Signed)
Anesthesia Post Note  Patient: Jessica Gates  Procedure(s) Performed: Procedure(s) (LRB): LAPAROSCOPIC CHOLECYSTECTOMY WITH INTRAOPERATIVE CHOLANGIOGRAM (N/A)  Patient location during evaluation: PACU Anesthesia Type: General Level of consciousness: awake and alert Pain management: pain level controlled Vital Signs Assessment: post-procedure vital signs reviewed and stable Respiratory status: spontaneous breathing and respiratory function stable Cardiovascular status: stable Anesthetic complications: no    Last Vitals:  Filed Vitals:   02/22/16 0908 02/22/16 1101  BP: 123/66 130/84  Pulse: 81   Temp: 36.6 C 36.1 C  Resp: 18 13    Last Pain:  Filed Vitals:   02/22/16 1104  PainSc: 3                  KEPHART,WILLIAM K

## 2016-02-22 NOTE — Anesthesia Preprocedure Evaluation (Signed)
Anesthesia Evaluation  Patient identified by MRN, date of birth, ID band Patient awake    Reviewed: Allergy & Precautions, H&P , NPO status , Patient's Chart, lab work & pertinent test results, reviewed documented beta blocker date and time   Airway Mallampati: I  TM Distance: >3 FB Neck ROM: full    Dental no notable dental hx. (+) Teeth Intact   Pulmonary neg pulmonary ROS,    Pulmonary exam normal breath sounds clear to auscultation       Cardiovascular Exercise Tolerance: Good negative cardio ROS Normal cardiovascular exam Rhythm:regular Rate:Normal     Neuro/Psych PSYCHIATRIC DISORDERS (Bipolar and ADHD) negative neurological ROS     GI/Hepatic Neg liver ROS, GERD  ,  Endo/Other  negative endocrine ROS  Renal/GU negative Renal ROS  negative genitourinary   Musculoskeletal   Abdominal   Peds  Hematology negative hematology ROS (+)   Anesthesia Other Findings Past Medical History:   Depression                                                   Bipolar disorder (HCC)                                       Anxiety                                                      Personality disorder                                         ADHD (attention deficit hyperactivity disorder)              GERD (gastroesophageal reflux disease)                       Headache                                                       Comment:MIGRAINES   Reproductive/Obstetrics negative OB ROS                             Anesthesia Physical Anesthesia Plan  ASA: I  Anesthesia Plan: General   Post-op Pain Management:    Induction:   Airway Management Planned:   Additional Equipment:   Intra-op Plan:   Post-operative Plan:   Informed Consent: I have reviewed the patients History and Physical, chart, labs and discussed the procedure including the risks, benefits and alternatives for the proposed  anesthesia with the patient or authorized representative who has indicated his/her understanding and acceptance.   Dental Advisory Given  Plan Discussed with: Anesthesiologist, CRNA and Surgeon  Anesthesia Plan Comments:         Anesthesia Quick Evaluation

## 2016-02-22 NOTE — Op Note (Signed)
Laparoscopic Cholecystectomy  Pre-operative Diagnosis: Biliary colic  Post-operative Diagnosis: Same  Procedure: Lap or scopic cholecystectomy  Surgeon: Adah Salvage. Excell Seltzer, MD FACS  Anesthesia: Gen. with endotracheal tube  Assistant: PA student  Procedure Details  The patient was seen again in the Holding Room. The benefits, complications, treatment options, and expected outcomes were discussed with the patient. The risks of bleeding, infection, recurrence of symptoms, failure to resolve symptoms, bile duct damage, bile duct leak, retained common bile duct stone, bowel injury, any of which could require further surgery and/or ERCP, stent, or papillotomy were reviewed with the patient. The likelihood of improving the patient's symptoms with return to their baseline status is good.  The patient and/or family concurred with the proposed plan, giving informed consent.  The patient was taken to Operating Room, identified as Jessica Gates and the procedure verified as Laparoscopic Cholecystectomy.  A Time Out was held and the above information confirmed.  Prior to the induction of general anesthesia, antibiotic prophylaxis was administered. VTE prophylaxis was in place. General endotracheal anesthesia was then administered and tolerated well. After the induction, the abdomen was prepped with Chloraprep and draped in the sterile fashion. The patient was positioned in the supine position.  Local anesthetic  was injected into the skin near the umbilicus and an incision made. The Veress needle was placed. Pneumoperitoneum was then created with CO2 and tolerated well without any adverse changes in the patient's vital signs. A 5mm port was placed in the periumbilical position and the abdominal cavity was explored.  Two 5-mm ports were placed in the right upper quadrant and a 12 mm epigastric port was placed all under direct vision. All skin incisions  were infiltrated with a local anesthetic agent before  making the incision and placing the trocars.   The patient was positioned  in reverse Trendelenburg, tilted slightly to the patient's left.  The gallbladder was identified, the fundus grasped and retracted cephalad. Adhesions were lysed bluntly. The infundibulum was grasped and retracted laterally, exposing the peritoneum overlying the triangle of Calot. This was then divided and exposed in a blunt fashion. A critical view of the cystic duct and cystic artery was obtained.  The cystic duct was clearly identified and bluntly dissected.   The cystic duct gallbladder junction was well identified the cystic artery was doubly clipped and divided this allowed for good visualization of the cystic duct as it entered the infundibulum of the gallbladder here it was doubly clipped and divided.  The gallbladder was taken from the gallbladder fossa in a retrograde fashion with the electrocautery. The gallbladder was removed and placed in an Endocatch bag. The liver bed was irrigated and inspected. Hemostasis was achieved with the electrocautery. Copious irrigation was utilized and was repeatedly aspirated until clear.  The gallbladder and Endocatch sac were then removed through the epigastric port site.    Inspection of the right upper quadrant was performed. No bleeding, bile duct injury or leak, or bowel injury was noted. Pneumoperitoneum was released.  The epigastric port site was closed with figure-of-eight 0 Vicryl sutures. 4-0 subcuticular Monocryl was used to close the skin. Steristrips and Mastisol and sterile dressings were  applied.  The patient was then extubated and brought to the recovery room in stable condition. Sponge, lap, and needle counts were correct at closure and at the conclusion of the case.   Findings: Large gallstones with chronic Cholecystitis   Estimated Blood Loss: Minimal         Drains:  None         Specimens: Gallbladder           Complications: none               Kenzlei Runions  E. Excell Seltzer, MD, FACS

## 2016-02-22 NOTE — Anesthesia Procedure Notes (Signed)
Procedure Name: Intubation Performed by: Lynnsey Barbara Pre-anesthesia Checklist: Patient identified, Patient being monitored, Timeout performed, Emergency Drugs available and Suction available Patient Re-evaluated:Patient Re-evaluated prior to inductionOxygen Delivery Method: Circle system utilized Preoxygenation: Pre-oxygenation with 100% oxygen Intubation Type: IV induction Ventilation: Mask ventilation without difficulty Laryngoscope Size: Miller and 2 Grade View: Grade I Tube type: Oral Tube size: 7.0 mm Number of attempts: 1 Placement Confirmation: ETT inserted through vocal cords under direct vision,  positive ETCO2 and breath sounds checked- equal and bilateral Secured at: 21 cm Tube secured with: Tape Dental Injury: Teeth and Oropharynx as per pre-operative assessment        

## 2016-02-22 NOTE — Discharge Instructions (Signed)
Remove dressing in 24 hours. °May shower in 24 hours. °Leave paper strips in place. °Resume all home medications. °Follow-up with Dr. Cooper in 10 days. ° °AMBULATORY SURGERY  °DISCHARGE INSTRUCTIONS ° ° °1) The drugs that you were given will stay in your system until tomorrow so for the next 24 hours you should not: ° °A) Drive an automobile °B) Make any legal decisions °C) Drink any alcoholic beverage ° ° °2) You may resume regular meals tomorrow.  Today it is better to start with liquids and gradually work up to solid foods. ° °You may eat anything you prefer, but it is better to start with liquids, then soup and crackers, and gradually work up to solid foods. ° ° °3) Please notify your doctor immediately if you have any unusual bleeding, trouble breathing, redness and pain at the surgery site, drainage, fever, or pain not relieved by medication. ° ° ° °4) Additional Instructions: ° ° ° ° ° ° ° °Please contact your physician with any problems or Same Day Surgery at [phone], Monday through Friday 6 am to 4 pm, or [place] [facility] number at [phone]. °

## 2016-02-23 LAB — SURGICAL PATHOLOGY

## 2016-02-27 ENCOUNTER — Telehealth: Payer: Self-pay

## 2016-02-27 NOTE — Telephone Encounter (Signed)
Patient called in at this time to see if she could move her appointment up. She states that she is having Shortness of Breath and Chest Pains at this time. I informed her she will need to be seen in the Emergency Room. She is on her way over to the hospital at this time.

## 2016-03-05 ENCOUNTER — Ambulatory Visit: Payer: BLUE CROSS/BLUE SHIELD | Admitting: Surgery

## 2016-03-06 ENCOUNTER — Encounter: Payer: Self-pay | Admitting: Surgery

## 2016-03-07 ENCOUNTER — Ambulatory Visit
Admission: RE | Admit: 2016-03-07 | Discharge: 2016-03-07 | Disposition: A | Discharge status: Home / Self Care | Payer: BLUE CROSS/BLUE SHIELD | Source: Ambulatory Visit | Attending: Surgery | Admitting: Surgery

## 2016-03-07 ENCOUNTER — Ambulatory Visit (INDEPENDENT_AMBULATORY_CARE_PROVIDER_SITE_OTHER): Payer: BLUE CROSS/BLUE SHIELD | Admitting: Surgery

## 2016-03-07 ENCOUNTER — Encounter: Payer: Self-pay | Admitting: Surgery

## 2016-03-07 VITALS — BP 131/84 | HR 69 | Temp 98.2°F | Ht 65.0 in | Wt 164.6 lb

## 2016-03-07 DIAGNOSIS — R0602 Shortness of breath: Secondary | ICD-10-CM | POA: Diagnosis not present

## 2016-03-07 DIAGNOSIS — K8020 Calculus of gallbladder without cholecystitis without obstruction: Secondary | ICD-10-CM

## 2016-03-07 NOTE — Patient Instructions (Addendum)
We will send you for a Chest X-ray today at the Hospital. We will call you with the results as soon as they become available.  Please see your Note for Work provided today.

## 2016-03-07 NOTE — Progress Notes (Signed)
Outpatient postop visit  03/07/2016  Jessica Gates is an 21 y.o. female.    Procedure:  Laparoscopic cholecystectomy    HPI:  's patient status post laparoscopic cholecystectomy for symptoms of biliary colic.  She complains of shortness of breath on exertion when walking that goes away spontaneously when resting. She also has nausea but no emesis has alternated diarrhea and constipation denies jaundice or acholic stools and denies fevers or chills  Medications reviewed.    Physical Exam:  LMP 01/23/2016 (Exact Date)    PE:  Chest clear to auscultation cardiac is regular rate and rhythm  Patient appears very comfortable and her respiratory rate is completely normal at this point at rest  No icterus no jaundice soft nontender abdomen wounds healing well without erythema or drainage calves are nontender and nonedematous    Assessment/Plan:   shortness of breath with exertion will obtain chest x-ray today but the patient appears very comfortable and does not appear ill at all she wants to go back to work but will reserve return to work until after chest x-ray completed otherwise she'll follow up on an as-needed basis  Jessica Hawichard E Sheyna Pettibone, MD, FACS

## 2016-03-12 ENCOUNTER — Telehealth: Payer: Self-pay | Admitting: Surgery

## 2016-03-12 NOTE — Telephone Encounter (Signed)
Patient would like results from her xray on 03/07/16. Please call and advise

## 2016-03-12 NOTE — Telephone Encounter (Signed)
Returned phone call to patient at this time. Explained that chest x-ray was negative from Friday.  Patient would like a return to work note to go back on Friday, 03/16/16. She works at [employer] and this needs to be faxed to [place].

## 2016-03-13 NOTE — Telephone Encounter (Signed)
Faxed disability note at this time.

## 2016-05-31 ENCOUNTER — Telehealth: Payer: Self-pay | Admitting: Surgery

## 2016-05-31 NOTE — Telephone Encounter (Signed)
Patient states that she is having pain in her upper abdomen for the last month when she awakes in the morning and then when she puts anything on her stomach. After surgery, she was not feeling this pain until 1 month ago. Patient denies nausea, vomiting, constipation. Is having some diarrhea as well. I suggested that this is most likely GI related as she was doing much better for the 3 months following her surgery and is now exhibiting symptoms. Suggested that she call and be seen by her PCP. She verbalizes understanding of this.

## 2016-05-31 NOTE — Telephone Encounter (Signed)
Patient had her gallbladder removed in Feb/2017 by Dr. Excell Seltzerooper and still having pain in her stomach and not being able to eat much. As soon as she eats she develops pain and has to go to the bathroom. Please call.

## 2016-07-12 ENCOUNTER — Telehealth: Payer: Self-pay | Admitting: Surgery

## 2016-07-12 NOTE — Telephone Encounter (Signed)
Dr. Excell Seltzerooper removed gallbladder in February. She has had diarrhea since February and getting worse. Burning in her intestinal area and can't keep food down. Either diarrhea or throwing up. No fever.

## 2016-07-13 NOTE — Telephone Encounter (Signed)
Patient stated she is taking OTC Nexium and it is not helping. She continues to have diarrhea every day. She stated she vomits 2-3 times a week due to stomach acid. No fever.No chills. Only able to eat small amounts of food at a time.  She stated she had called her PCP [facility])  but in order to get a referral she needs to have a physical. We discussed this is mostly GI related and she should call and make an appointment to see her PCP. Patient stated she would do so and verbalized understanding.

## 2016-07-23 ENCOUNTER — Other Ambulatory Visit: Payer: Self-pay

## 2016-07-23 ENCOUNTER — Telehealth: Payer: Self-pay

## 2016-07-23 DIAGNOSIS — R1013 Epigastric pain: Secondary | ICD-10-CM

## 2016-07-23 NOTE — Telephone Encounter (Signed)
LVM on both Home and cell phone to call office. Per Dr.Cooper he wants patient to have CBC, CMP,Lipase prior to her appointment on Wednesday.   Above orders have been entered.

## 2016-07-24 ENCOUNTER — Other Ambulatory Visit
Admission: RE | Admit: 2016-07-24 | Discharge: 2016-07-24 | Disposition: A | Discharge status: Home / Self Care | Payer: BLUE CROSS/BLUE SHIELD | Source: Ambulatory Visit | Attending: Surgery | Admitting: Surgery

## 2016-07-24 DIAGNOSIS — R1013 Epigastric pain: Secondary | ICD-10-CM | POA: Diagnosis not present

## 2016-07-24 LAB — CBC WITH DIFFERENTIAL/PLATELET
Basophils Absolute: 0 K/uL (ref 0–0.1)
Basophils Relative: 1 %
Eosinophils Absolute: 0.2 K/uL (ref 0–0.7)
Eosinophils Relative: 4 %
HCT: 40.3 % (ref 35.0–47.0)
Hemoglobin: 14.3 g/dL (ref 12.0–16.0)
Lymphocytes Relative: 33 %
Lymphs Abs: 1.8 K/uL (ref 1.0–3.6)
MCH: 31.4 pg (ref 26.0–34.0)
MCHC: 35.6 g/dL (ref 32.0–36.0)
MCV: 88.3 fL (ref 80.0–100.0)
Monocytes Absolute: 0.3 K/uL (ref 0.2–0.9)
Monocytes Relative: 6 %
Neutro Abs: 3.1 K/uL (ref 1.4–6.5)
Neutrophils Relative %: 56 %
Platelets: 205 K/uL (ref 150–440)
RBC: 4.56 MIL/uL (ref 3.80–5.20)
RDW: 13.8 % (ref 11.5–14.5)
WBC: 5.5 K/uL (ref 3.6–11.0)

## 2016-07-24 LAB — COMPREHENSIVE METABOLIC PANEL
ALBUMIN: 4.3 g/dL (ref 3.5–5.0)
ALK PHOS: 88 U/L (ref 38–126)
ALT: 14 U/L (ref 14–54)
ANION GAP: 7 (ref 5–15)
AST: 18 U/L (ref 15–41)
BUN: 14 mg/dL (ref 6–20)
CALCIUM: 9.5 mg/dL (ref 8.9–10.3)
CO2: 23 mmol/L (ref 22–32)
Chloride: 107 mmol/L (ref 101–111)
Creatinine, Ser: 0.66 mg/dL (ref 0.44–1.00)
GFR calc non Af Amer: >60 mL/min (ref >60)
GLUCOSE: 84 mg/dL (ref 65–99)
POTASSIUM: 4 mmol/L (ref 3.5–5.1)
SODIUM: 137 mmol/L (ref 135–145)
Total Bilirubin: 1 mg/dL (ref 0.3–1.2)
Total Protein: 7.3 g/dL (ref 6.5–8.1)

## 2016-07-24 LAB — LIPASE, BLOOD: Lipase: 15 U/L (ref 11–51)

## 2016-07-24 NOTE — Telephone Encounter (Signed)
Patient called and she was instructed to visit the lab today for CBC,CMP and Lipase blood work prior to her office visit on 07/25/16.

## 2016-07-25 ENCOUNTER — Telehealth: Payer: Self-pay

## 2016-07-25 ENCOUNTER — Encounter: Payer: Self-pay | Admitting: Surgery

## 2016-07-25 ENCOUNTER — Ambulatory Visit (INDEPENDENT_AMBULATORY_CARE_PROVIDER_SITE_OTHER): Payer: BLUE CROSS/BLUE SHIELD | Admitting: Surgery

## 2016-07-25 VITALS — BP 123/69 | HR 64 | Temp 98.4°F | Ht 65.0 in | Wt 150.0 lb

## 2016-07-25 DIAGNOSIS — R1013 Epigastric pain: Secondary | ICD-10-CM

## 2016-07-25 NOTE — Progress Notes (Signed)
Outpatient Surgical Follow Up  07/25/2016  Jessica Gates is an 21 y.o. female.   CC: Abdominal pain nausea vomiting and diarrhea  HPI: This a patient who underwent a laparoscopic cholecystectomy for biliary colic back in February 2017 and states that ever since then she has had diarrhea up to 5 or 6 stools a day which is watery nausea and vomiting after eating and pain in the midline epigastrium. She states it is not cramping in nature but that it feels like when she eats food sits in the same spot in her stomach. She has some mild lower abdominal pain on occasion no blood in her stool no blood in her vomitus and no melena she denies fevers or chills. Patient describes a 10 pound weight loss. He is not used any medications to try to improve her symptoms but did attempt utilization Nexium earlier in the course of this when it was causing her problems in the springtime but she has not tried it since and appears to have tried an adequate length the trial of Nexium but it did not help at all. She often wakes up in the morning on an empty stomach with pain as well.  Past Medical History:  Diagnosis Date  . ADHD (attention deficit hyperactivity disorder)   . Anxiety   . Bipolar disorder ([place])   . Depression   . GERD (gastroesophageal reflux disease)   . Headache    MIGRAINES  . Hyperlipidemia   . Personality disorder     Past Surgical History:  Procedure Laterality Date  . CHOLECYSTECTOMY N/A 02/22/2016   Procedure: LAPAROSCOPIC CHOLECYSTECTOMY WITH INTRAOPERATIVE CHOLANGIOGRAM;  Surgeon: Florene Glen, MD;  Location: [facility] ORS;  Service: General;  Laterality: N/A;    Family History  Problem Relation Age of Onset  . Cholelithiasis Mother   . Cancer Mother 42    kidney  . Mental illness Mother   . Appendicitis Father   . Cancer Maternal Grandfather     lung and throat  . Diabetes Paternal Grandmother     Social History:  reports that she has never smoked. She has never used  smokeless tobacco. She reports that she does not drink alcohol or use drugs.  Allergies: No Known Allergies  Medications reviewed.   Review of Systems:   Review of Systems  Constitutional: Negative for chills and fever.  HENT: Negative.   Eyes: Negative.   Respiratory: Negative.   Cardiovascular: Negative.   Gastrointestinal: Positive for abdominal pain, diarrhea, nausea and vomiting. Negative for blood in stool, constipation, heartburn and melena.  Genitourinary: Negative.   Musculoskeletal: Negative.   Skin: Negative.   Neurological: Negative.   Endo/Heme/Allergies: Negative.   Psychiatric/Behavioral: Negative.      Physical Exam:  BP 123/69 (BP Location: Right Arm, Patient Position: Sitting, Cuff Size: Small)   Pulse 64   Temp 98.4 F (36.9 C) (Oral)   Ht _0  (1.651 m)   Wt 150 lb (68 kg)   LMP 06/16/2016 (Exact Date)   BMI 24.96 kg/m   Physical Exam  Constitutional: She is oriented to person, place, and time and well-developed, well-nourished, and in no distress. No distress.  HENT:  Head: Normocephalic and atraumatic.  Eyes: Pupils are equal, round, and reactive to light. Right eye exhibits no discharge. Left eye exhibits no discharge. No scleral icterus.  Neck: Normal range of motion.  Cardiovascular: Normal rate, regular rhythm and normal heart sounds.   Pulmonary/Chest: Breath sounds normal. No respiratory distress. She has  no wheezes. She has no rales.  Abdominal: Soft. She exhibits no distension. There is tenderness. There is no rebound and no guarding.  Wounds are all well healed without erythema or drainage abdomen is nondistended nontympanitic and only minimally tender on the midline not tender in the right upper quadrant  Musculoskeletal: Normal range of motion. She exhibits no edema.  Lymphadenopathy:    She has no cervical adenopathy.  Neurological: She is alert and oriented to person, place, and time.  Skin: Skin is warm and dry. She is not  diaphoretic.  Tattoos, no jaundice  Psychiatric: Mood and affect normal.      Results for orders placed or performed during the hospital encounter of 07/24/16 (from the past 48 hour(s))  CBC with Differential     Status: None   Collection Time: 07/24/16 11:55 AM  Result Value Ref Range   WBC 5.5 3.6 - 11.0 K/uL   RBC 4.56 3.80 - 5.20 MIL/uL   Hemoglobin 14.3 12.0 - 16.0 g/dL   HCT 40.3 35.0 - 47.0 %   MCV 88.3 80.0 - 100.0 fL   MCH 31.4 26.0 - 34.0 pg   MCHC 35.6 32.0 - 36.0 g/dL   RDW 13.8 11.5 - 14.5 %   Platelets 205 150 - 440 K/uL   Neutrophils Relative % 56 %   Neutro Abs 3.1 1.4 - 6.5 K/uL   Lymphocytes Relative 33 %   Lymphs Abs 1.8 1.0 - 3.6 K/uL   Monocytes Relative 6 %   Monocytes Absolute 0.3 0.2 - 0.9 K/uL   Eosinophils Relative 4 %   Eosinophils Absolute 0.2 0 - 0.7 K/uL   Basophils Relative 1 %   Basophils Absolute 0.0 0 - 0.1 K/uL  Comprehensive metabolic panel     Status: None   Collection Time: 07/24/16 11:55 AM  Result Value Ref Range   Sodium 137 135 - 145 mmol/L   Potassium 4.0 3.5 - 5.1 mmol/L   Chloride 107 101 - 111 mmol/L   CO2 23 22 - 32 mmol/L   Glucose, Bld 84 65 - 99 mg/dL   BUN 14 6 - 20 mg/dL   Creatinine, Ser 0.66 0.44 - 1.00 mg/dL   Calcium 9.5 8.9 - 10.3 mg/dL   Total Protein 7.3 6.5 - 8.1 g/dL   Albumin 4.3 3.5 - 5.0 g/dL   AST 18 15 - 41 U/L   ALT 14 14 - 54 U/L   Alkaline Phosphatase 88 38 - 126 U/L   Total Bilirubin 1.0 0.3 - 1.2 mg/dL   GFR calc non Af Amer >60 >60 mL/min   GFR calc Af Amer >60 >60 mL/min    Comment: (NOTE) The eGFR has been calculated using the CKD EPI equation. This calculation has not been validated in all clinical situations. eGFR's persistently <60 mL/min signify possible Chronic Kidney Disease.    Anion gap 7 5 - 15  Lipase, blood     Status: None   Collection Time: 07/24/16 11:55 AM  Result Value Ref Range   Lipase 15 11 - 51 U/L   No results found.  Assessment/Plan:  Abdominal pain nausea  and vomiting and diarrhea following laparoscopic cholecystectomy.  Operative report and pathology report are reviewed showing that the gallbladder was showing signs of chronic cholecystitis. I had labs drawn prior to this visit which are all completely normal including white blood cell count and liver function tests.  The etiology of this patient's pain is unclear to me and the cause  of her nausea vomiting and diarrhea are unclear but she will require a GI workup I would like to obtain a C. difficile PCR at this point. She has pain often not just postprandial but when she wakes up in the morning suggesting an empty stomach which would confirm the presence of hyperacidity syndrome or peptic ulcer disease but she has tried Nexium for several weeks at a time and not had any improvement. Strongly urged her to keep her appointment with GI to assess this situation. Florene Glen, MD, FACS

## 2016-07-25 NOTE — Telephone Encounter (Signed)
Placed call to patient at this time with an appointment to see Dr.Wohl on 08/22/16 at 10.00. Reminder mailed  today as well.

## 2016-07-25 NOTE — Patient Instructions (Signed)
We are sending a referral to Dr.Wohl - Gastroenterologist. Someone will contact you and schedule an appointment. Please go by the lad today and pick up lab supplies. We will call you with the results.

## 2016-07-26 NOTE — Telephone Encounter (Signed)
Spoke with patient at this time and she stated she forgot to by the lab to pick up c-Diff supplies,however she stated she would go today. She was also reminded of her appointment with Dr.Wohl of which she stated she did receive my message regarding the appointment.I let patient know I would call her with results of her lab work as soon as I receive them.

## 2016-07-27 NOTE — Telephone Encounter (Signed)
I spoke with patient and she picked up the C-Diff kit and is in the process of collecting the samples.

## 2016-07-31 NOTE — Telephone Encounter (Signed)
Spoke with patient at this time. She said she would be dropping off her collected specimens today for C-Diff.Ilet her know I would contact her with the results.

## 2016-08-03 NOTE — Telephone Encounter (Signed)
LVM for patient at this time to see if I could possibly find out when she plans to bring the C-Diff specimen to the lab. I have made multiple attempts following up with this and the patient is non-compliant.

## 2016-08-22 ENCOUNTER — Telehealth: Payer: Self-pay | Admitting: Gastroenterology

## 2016-08-22 ENCOUNTER — Ambulatory Visit: Payer: Self-pay | Admitting: Gastroenterology

## 2016-08-22 NOTE — Telephone Encounter (Signed)
Patient called to reschedule her appointment with Dr. Servando SnareWohl. She stated that she overslept but I explained to her I already called and her sister said she left 25 minutes ago for this appointment. She didn't say anything but rescheduled.

## 2016-09-26 ENCOUNTER — Emergency Department
Admission: EM | Admit: 2016-09-26 | Discharge: 2016-09-26 | Disposition: A | Discharge status: Home / Self Care | Payer: BLUE CROSS/BLUE SHIELD | Attending: Emergency Medicine | Admitting: Emergency Medicine

## 2016-09-26 ENCOUNTER — Encounter: Payer: Self-pay | Admitting: Emergency Medicine

## 2016-09-26 DIAGNOSIS — R197 Diarrhea, unspecified: Secondary | ICD-10-CM | POA: Insufficient documentation

## 2016-09-26 DIAGNOSIS — G8929 Other chronic pain: Secondary | ICD-10-CM | POA: Diagnosis not present

## 2016-09-26 DIAGNOSIS — F909 Attention-deficit hyperactivity disorder, unspecified type: Secondary | ICD-10-CM | POA: Insufficient documentation

## 2016-09-26 DIAGNOSIS — R1013 Epigastric pain: Secondary | ICD-10-CM | POA: Insufficient documentation

## 2016-09-26 DIAGNOSIS — R112 Nausea with vomiting, unspecified: Secondary | ICD-10-CM | POA: Insufficient documentation

## 2016-09-26 LAB — COMPREHENSIVE METABOLIC PANEL
ALBUMIN: 4.6 g/dL (ref 3.5–5.0)
ALK PHOS: 96 U/L (ref 38–126)
ALT: 13 U/L — ABNORMAL LOW (ref 14–54)
ANION GAP: 7 (ref 5–15)
AST: 15 U/L (ref 15–41)
BUN: 11 mg/dL (ref 6–20)
CALCIUM: 9.6 mg/dL (ref 8.9–10.3)
CHLORIDE: 105 mmol/L (ref 101–111)
CO2: 24 mmol/L (ref 22–32)
Creatinine, Ser: 0.77 mg/dL (ref 0.44–1.00)
GFR calc non Af Amer: >60 mL/min (ref >60)
Glucose, Bld: 93 mg/dL (ref 65–99)
POTASSIUM: 3.6 mmol/L (ref 3.5–5.1)
SODIUM: 136 mmol/L (ref 135–145)
Total Bilirubin: 1.1 mg/dL (ref 0.3–1.2)
Total Protein: 7.6 g/dL (ref 6.5–8.1)

## 2016-09-26 LAB — CBC
HEMATOCRIT: 42.1 % (ref 35.0–47.0)
HEMOGLOBIN: 14.9 g/dL (ref 12.0–16.0)
MCH: 31.6 pg (ref 26.0–34.0)
MCHC: 35.5 g/dL (ref 32.0–36.0)
MCV: 89 fL (ref 80.0–100.0)
Platelets: 242 10*3/uL (ref 150–440)
RBC: 4.73 MIL/uL (ref 3.80–5.20)
RDW: 13.5 % (ref 11.5–14.5)
WBC: 5.5 10*3/uL (ref 3.6–11.0)

## 2016-09-26 LAB — LIPASE, BLOOD: LIPASE: 16 U/L (ref 11–51)

## 2016-09-26 MED ORDER — FAMOTIDINE 20 MG PO TABS
40.0000 mg | ORAL_TABLET | Freq: Once | ORAL | Status: AC
  Administered 2016-09-26: 40 mg via ORAL
  Filled 2016-09-26: qty 2

## 2016-09-26 MED ORDER — ONDANSETRON 4 MG PO TBDP
8.0000 mg | ORAL_TABLET | Freq: Once | ORAL | Status: AC
  Administered 2016-09-26: 8 mg via ORAL
  Filled 2016-09-26: qty 2

## 2016-09-26 MED ORDER — OXYCODONE-ACETAMINOPHEN 5-325 MG PO TABS: 1.0000 | ORAL_TABLET | Freq: Four times a day (QID) | ORAL | 0 refills | Status: DC | PRN

## 2016-09-26 MED ORDER — FAMOTIDINE 20 MG PO TABS: 20.0000 mg | ORAL_TABLET | Freq: Two times a day (BID) | ORAL | 0 refills | Status: DC

## 2016-09-26 MED ORDER — GI COCKTAIL ~~LOC~~
30.0000 mL | ORAL | Status: AC
  Administered 2016-09-26: 30 mL via ORAL
  Filled 2016-09-26: qty 30

## 2016-09-26 MED ORDER — ONDANSETRON 4 MG PO TBDP: 4.0000 mg | ORAL_TABLET | Freq: Three times a day (TID) | ORAL | 0 refills | Status: DC | PRN

## 2016-09-26 MED ORDER — FAMOTIDINE 20 MG PO TABS
ORAL_TABLET | ORAL | Status: AC
  Filled 2016-09-26: qty 1

## 2016-09-26 NOTE — ED Triage Notes (Signed)
Pt presents with n/v/d on and off since her surgery back in Feb. Pt had her gallbladder removed then. Pt states she has lost over 30lbs and cannot keep anything down.

## 2016-09-26 NOTE — ED Provider Notes (Signed)
[facility] Emergency Department Provider Note  ____________________________________________  Time seen: Approximately 10:00 AM  I have reviewed the triage vital signs and the nursing notes.   HISTORY  Chief Complaint Abdominal Pain; Emesis; and Diarrhea    HPI Jessica Gates is a 21 y.o. female with a history of chronic cholecystitis status post laparoscopic cholecystectomy February 2017 who reports persistent epigastric pain and vomiting and diarrhea and oral intolerance since then. She describes a weight loss of about 30 pounds in this time.. She followed up with her surgeon Dr. Excell Seltzer 2 months ago who referred her to gastroenterology. She's had trouble following up with them, but just received a phone call that they will see her in clinic tomorrow.  No chest pain shortness of breath fever chills dizziness or syncope.     Past Medical History:  Diagnosis Date  . ADHD (attention deficit hyperactivity disorder)   . Anxiety   . Bipolar disorder (HCC)   . Depression   . GERD (gastroesophageal reflux disease)   . Headache    MIGRAINES  . Hyperlipidemia   . Personality disorder      Patient Active Problem List   Diagnosis Date Noted  . Biliary colic   . Cholelithiasis 02/16/2016     Past Surgical History:  Procedure Laterality Date  . CHOLECYSTECTOMY N/A 02/22/2016   Procedure: LAPAROSCOPIC CHOLECYSTECTOMY WITH INTRAOPERATIVE CHOLANGIOGRAM;  Surgeon: Lattie Haw, MD;  Location: [facility] ORS;  Service: General;  Laterality: N/A;     Prior to Admission medications   Medication Sig Start Date End Date Taking? Authorizing Provider  famotidine (PEPCID) 20 MG tablet Take 1 tablet (20 mg total) by mouth 2 (two) times daily. 09/26/16   Sharman Cheek, MD  ondansetron (ZOFRAN ODT) 4 MG disintegrating tablet Take 1 tablet (4 mg total) by mouth every 8 (eight) hours as needed for nausea or vomiting. 09/26/16   Sharman Cheek, MD   oxyCODONE-acetaminophen (ROXICET) 5-325 MG tablet Take 1 tablet by mouth every 6 (six) hours as needed for severe pain. 09/26/16   Sharman Cheek, MD     Allergies Review of patient's allergies indicates no known allergies.   Family History  Problem Relation Age of Onset  . Cholelithiasis Mother   . Cancer Mother 30    kidney  . Mental illness Mother   . Appendicitis Father   . Cancer Maternal Grandfather     lung and throat  . Diabetes Paternal Grandmother     Social History Social History  Substance Use Topics  . Smoking status: Never Smoker  . Smokeless tobacco: Never Used  . Alcohol use No    Review of Systems  Constitutional:   No fever or chills.  ENT:   No sore throat. No rhinorrhea. Cardiovascular:   No chest pain. Respiratory:   No dyspnea or cough. Gastrointestinal:   Positive epigastric pain with vomiting and diarrhea, chronic.  10-point ROS otherwise negative.  ____________________________________________   PHYSICAL EXAM:  VITAL SIGNS: ED Triage Vitals  Enc Vitals Group     BP 09/26/16 0909 131/85     Pulse Rate 09/26/16 0909 78     Resp 09/26/16 0909 20     Temp 09/26/16 0909 97.7 F (36.5 C)     Temp Source 09/26/16 0909 Oral     SpO2 09/26/16 0909 99 %     Weight 09/26/16 0910 145 lb (65.8 kg)     Height 09/26/16 0910 5\' 5"  (1.651 m)     Head  Circumference --      Peak Flow --      Pain Score 09/26/16 0910 5     Pain Loc --      Pain Edu? --      Excl. in GC? --     Vital signs reviewed, nursing assessments reviewed.   Constitutional:   Alert and oriented. Well appearing and in no distress. Eyes:   No scleral icterus. No conjunctival pallor. PERRL. EOMI.  No nystagmus. ENT   Head:   Normocephalic and atraumatic.   Nose:   No congestion/rhinnorhea. No septal hematoma   Mouth/Throat:   MMM, Mild dull pharyngeal erythema. No peritonsillar mass.    Neck:   No stridor. No SubQ emphysema. No  meningismus. Hematological/Lymphatic/Immunilogical:   No cervical lymphadenopathy. Cardiovascular:   RRR. Symmetric bilateral radial and DP pulses.  No murmurs.  Respiratory:   Normal respiratory effort without tachypnea nor retractions. Breath sounds are clear and equal bilaterally. No wheezes/rales/rhonchi. Gastrointestinal:   Soft without focal tenderness.. Non distended. There is no CVA tenderness.  No rebound, rigidity, or guarding. Genitourinary:   deferred Musculoskeletal:   Nontender with normal range of motion in all extremities. No joint effusions.  No lower extremity tenderness.  No edema. Neurologic:   Normal speech and language.  CN 2-10 normal. Motor grossly intact. No gross focal neurologic deficits are appreciated.  Skin:    Skin is warm, dry and intact. No rash noted.  No petechiae, purpura, or bullae.  ____________________________________________    LABS (pertinent positives/negatives) (all labs ordered are listed, but only abnormal results are displayed) Labs Reviewed  COMPREHENSIVE METABOLIC PANEL - Abnormal; Notable for the following:       Result Value   ALT 13 (*)    All other components within normal limits  LIPASE, BLOOD  CBC  URINALYSIS COMPLETEWITH MICROSCOPIC ([facility] ONLY)   ____________________________________________   EKG    ____________________________________________    RADIOLOGY    ____________________________________________   PROCEDURES Procedures  ____________________________________________   INITIAL IMPRESSION / ASSESSMENT AND PLAN / ED COURSE  Pertinent labs & imaging results that were available during my care of the patient were reviewed by me and considered in my medical decision making (see chart for details).  Patient well. No acute distress. Complaints of chronic epigastric pain and vomiting and diarrhea. Has follow up with GI as referred by her surgeon tomorrow. GI cocktail and antacids for now in case this helps  with her symptoms. Encouraged her to keep this appointment.Considering the patient's symptoms, medical history, and physical examination today, I have low suspicion for cholecystitis or biliary pathology, pancreatitis, perforation or bowel obstruction, hernia, intra-abdominal abscess, AAA or dissection, volvulus or intussusception, mesenteric ischemia, or appendicitis.       Clinical Course   ____________________________________________   FINAL CLINICAL IMPRESSION(S) / ED DIAGNOSES  Final diagnoses:  Epigastric pain  Nausea vomiting and diarrhea       Portions of this note were generated with dragon dictation software. Dictation errors may occur despite best attempts at proofreading.    Sharman CheekPhillip Marwan Lipe, MD 09/26/16 1003

## 2016-09-27 ENCOUNTER — Encounter: Payer: Self-pay | Admitting: Gastroenterology

## 2016-09-27 ENCOUNTER — Ambulatory Visit (INDEPENDENT_AMBULATORY_CARE_PROVIDER_SITE_OTHER): Payer: BLUE CROSS/BLUE SHIELD | Admitting: Gastroenterology

## 2016-09-27 VITALS — BP 118/67 | HR 66 | Temp 98.3°F | Ht 65.0 in | Wt 143.0 lb

## 2016-09-27 DIAGNOSIS — R197 Diarrhea, unspecified: Secondary | ICD-10-CM | POA: Diagnosis not present

## 2016-09-27 DIAGNOSIS — R112 Nausea with vomiting, unspecified: Secondary | ICD-10-CM

## 2016-09-27 DIAGNOSIS — G8929 Other chronic pain: Secondary | ICD-10-CM

## 2016-09-27 DIAGNOSIS — R1013 Epigastric pain: Secondary | ICD-10-CM | POA: Diagnosis not present

## 2016-09-27 MED ORDER — CHOLESTYRAMINE 4 G PO PACK: 4.0000 g | PACK | Freq: Four times a day (QID) | ORAL | 12 refills | Status: DC

## 2016-09-27 NOTE — Progress Notes (Signed)
Gastroenterology Consultation  Referring Provider:     Pa, Amada Jupiter* Primary Care Physician:  [facility] Primary Gastroenterologist:  Dr. Servando Snare     Reason for Consultation:     Diarrhea and nausea vomiting and abdominal pain        HPI:   Jessica Gates is a 21 y.o. y/o female referred for consultation & management of Diarrhea nausea vomiting and abdominal pain by Dr. [facility].  This patient comes in today after having her gallbladder out. The patient reports that she had her gallbladder out due to abdominal pain. The patient states that after the surgery she started to have diarrhea. The patient was in emergency room yesterday given Zofran and a GI cocktail. The patient was also sent home with pain medication. She reports that she is having upwards of 10 bowel movements a day and sometimes the diarrhea will wake her up at night. She did not have the diarrhea before having the gallbladder out but did report that she had intermittent loose stools alternating with normal bowel movements for some time. She has lost approximately 30 pounds during this time. Nizatidine bloody stools but does report that she has black stools once to twice a week. She denies taking any Pepto-Bismol or iron supplementation or eating beets. She also reports that she has lower abdominal pain in the right lower quadrant and epigastric pain. Past Medical History:  Diagnosis Date  . ADHD (attention deficit hyperactivity disorder)   . Anxiety   . Bipolar disorder (HCC)   . Depression   . GERD (gastroesophageal reflux disease)   . Headache    MIGRAINES  . Hyperlipidemia   . Personality disorder     Past Surgical History:  Procedure Laterality Date  . CHOLECYSTECTOMY N/A 02/22/2016   Procedure: LAPAROSCOPIC CHOLECYSTECTOMY WITH INTRAOPERATIVE CHOLANGIOGRAM;  Surgeon: Lattie Haw, MD;  Location: [facility] ORS;  Service: General;  Laterality: N/A;    Prior to Admission  medications   Medication Sig Start Date End Date Taking? Authorizing Provider  famotidine (PEPCID) 20 MG tablet Take 1 tablet (20 mg total) by mouth 2 (two) times daily. 09/26/16  Yes Sharman Cheek, MD  ondansetron (ZOFRAN ODT) 4 MG disintegrating tablet Take 1 tablet (4 mg total) by mouth every 8 (eight) hours as needed for nausea or vomiting. 09/26/16  Yes Sharman Cheek, MD  oxyCODONE-acetaminophen (ROXICET) 5-325 MG tablet Take 1 tablet by mouth every 6 (six) hours as needed for severe pain. 09/26/16  Yes Sharman Cheek, MD    Family History  Problem Relation Age of Onset  . Cholelithiasis Mother   . Cancer Mother 30    kidney  . Mental illness Mother   . Appendicitis Father   . Cancer Maternal Grandfather     lung and throat  . Diabetes Paternal Grandmother      Social History  Substance Use Topics  . Smoking status: Never Smoker  . Smokeless tobacco: Never Used  . Alcohol use No    Allergies as of 09/27/2016  . (No Known Allergies)    Review of Systems:    All systems reviewed and negative except where noted in HPI.   Physical Exam:  BP 118/67   Pulse 66   Temp 98.3 F (36.8 C) (Oral)   Ht 5\' 5"  (1.651 m)   Wt 143 lb (64.9 kg)   LMP 09/18/2016 (Exact Date)   BMI 23.80 kg/m  Patient's last menstrual period was 09/18/2016 (exact date). Psych:  Alert and cooperative. Normal mood and affect. General:   Alert,  Well-developed, well-nourished, pleasant and cooperative in NAD Head:  Normocephalic and atraumatic. Eyes:  Sclera clear, no icterus.   Conjunctiva pink. Ears:  Normal auditory acuity. Nose:  No deformity, discharge, or lesions. Mouth:  No deformity or lesions,oropharynx pink & moist. Neck:  Supple; no masses or thyromegaly. Lungs:  Respirations even and unlabored.  Clear throughout to auscultation.   No wheezes, crackles, or rhonchi. No acute distress. Heart:  Regular rate and rhythm; no murmurs, clicks, rubs, or gallops. Abdomen:  Normal bowel  sounds.  No bruits.  Soft, mildly tender in the epigastric area and right lower quadrant and non-distended without masses, hepatosplenomegaly or hernias noted.  No guarding or rebound tenderness.  Negative Carnett sign.   Rectal:  Deferred.  Msk:  Symmetrical without gross deformities.  Good, equal movement & strength bilaterally. Pulses:  Normal pulses noted. Extremities:  No clubbing or edema.  No cyanosis. Neurologic:  Alert and oriented x3;  grossly normal neurologically. Skin:  Intact without significant lesions or rashes.  No jaundice. Lymph Nodes:  No significant cervical adenopathy. Psych:  Alert and cooperative. Normal mood and affect.  Imaging Studies: No results found.  Assessment and Plan:   Jessica Gates is a 21 y.o. y/o female who comes in today with multiple GI problems since having her gallbladder out. The patient likely has a history of irritable bowel syndrome that has been made worse by having her gallbladder removed. The patient has been having diarrhea since the gallbladder removal. The patient will be started on Questran and for possible biliary diarrhea. The patient will also be started on Dexilant for her nausea and vomiting. She will be set up for an EGD because of her intermittent black stools. If her symptoms continue she may need an MRCP and/or colonoscopy. The patient has been explained the plan and agrees with it   Note: This dictation was prepared with Dragon dictation along with smaller phrase technology. Any transcriptional errors that result from this process are unintentional.

## 2016-10-01 ENCOUNTER — Telehealth: Payer: Self-pay

## 2016-10-01 ENCOUNTER — Other Ambulatory Visit: Payer: Self-pay

## 2016-10-01 DIAGNOSIS — R197 Diarrhea, unspecified: Secondary | ICD-10-CM

## 2016-10-01 MED ORDER — COLESEVELAM HCL 625 MG PO TABS: 625.0000 mg | ORAL_TABLET | Freq: Two times a day (BID) | ORAL | 6 refills | Status: DC

## 2016-10-01 NOTE — Telephone Encounter (Signed)
See message below.   What other diarrhea medication can we give her?

## 2016-10-01 NOTE — Telephone Encounter (Signed)
Patient was seen on 09/27/2016. Dr. Servando SnareWohl gave her Cholestyramine. She is unable to keep that down. She wants to know what else she is able to take. The other medication given to her is working fine.

## 2016-10-09 NOTE — Telephone Encounter (Signed)
Ginger please call patient at [phone]. She is still waiting on your call regarding diarrhea medication.

## 2016-10-09 NOTE — Telephone Encounter (Signed)
LVM for pt to return my call.

## 2016-10-09 NOTE — Telephone Encounter (Signed)
Patient is giving Jessica Gates permission [name]. That is the number that I gave you.

## 2016-10-10 NOTE — Telephone Encounter (Signed)
Spoke with pt's mother about the Welchol medication. She is currently taking 635mg  twice daily but is still having some diarrhea. Can we increase this? Looks like she can take up to 6 pills daily? Please advise.

## 2016-10-11 NOTE — Telephone Encounter (Signed)
Yes please crease the dose. See if she is feeling better even a bit with the lower dose.

## 2016-10-12 NOTE — Telephone Encounter (Signed)
Left vm for pt's mother Elnita MaxwellCheryl to return my call.

## 2016-10-24 ENCOUNTER — Other Ambulatory Visit: Payer: Self-pay

## 2016-10-31 ENCOUNTER — Encounter: Payer: Self-pay | Admitting: *Deleted

## 2016-10-31 ENCOUNTER — Encounter: Payer: Self-pay | Admitting: Anesthesiology

## 2016-11-08 ENCOUNTER — Ambulatory Visit
Admission: RE | Admit: 2016-11-08 | Payer: BLUE CROSS/BLUE SHIELD | Source: Ambulatory Visit | Admitting: Gastroenterology

## 2016-11-08 SURGERY — COLONOSCOPY WITH PROPOFOL
Anesthesia: Choice

## 2016-11-29 ENCOUNTER — Telehealth: Payer: Self-pay | Admitting: Gastroenterology

## 2016-11-29 NOTE — Telephone Encounter (Signed)
Could you please send a [facility] for [facility] opinion on abdominal pain? Thank you.

## 2016-11-29 NOTE — Telephone Encounter (Signed)
Patient would like to speak to you.

## 2016-11-29 NOTE — Telephone Encounter (Signed)
Spoke with patient at this time. [facility] in regards to abdominal pain.  Please send referral.

## 2016-11-30 NOTE — Telephone Encounter (Signed)
I have faxed a [facility] for [facility]--abdominal pain.  Clinic notes have been attached to the referral and faxed to [phone].  I will follow up within 5 business days to make sure patient has been contacted with an appointment [facility].

## 2016-12-06 NOTE — Telephone Encounter (Signed)
[facility]. Patient seen Dr Britt BolognesePhilip Brondon [facility] on 12/06/16.  Referral completed.

## 2016-12-12 IMAGING — CR DG CHEST 2V
1 series · 2 of 2 positions shown · non-contrast
Comparison: None.

CLINICAL DATA: Shortness of breath following cholecystectomy 2
weeks ago

EXAM:
CHEST  2 VIEW

[Series 1: dg chest 2 view · 0.14mm/px · 2 of 2 slices shown]
[im 1/2]
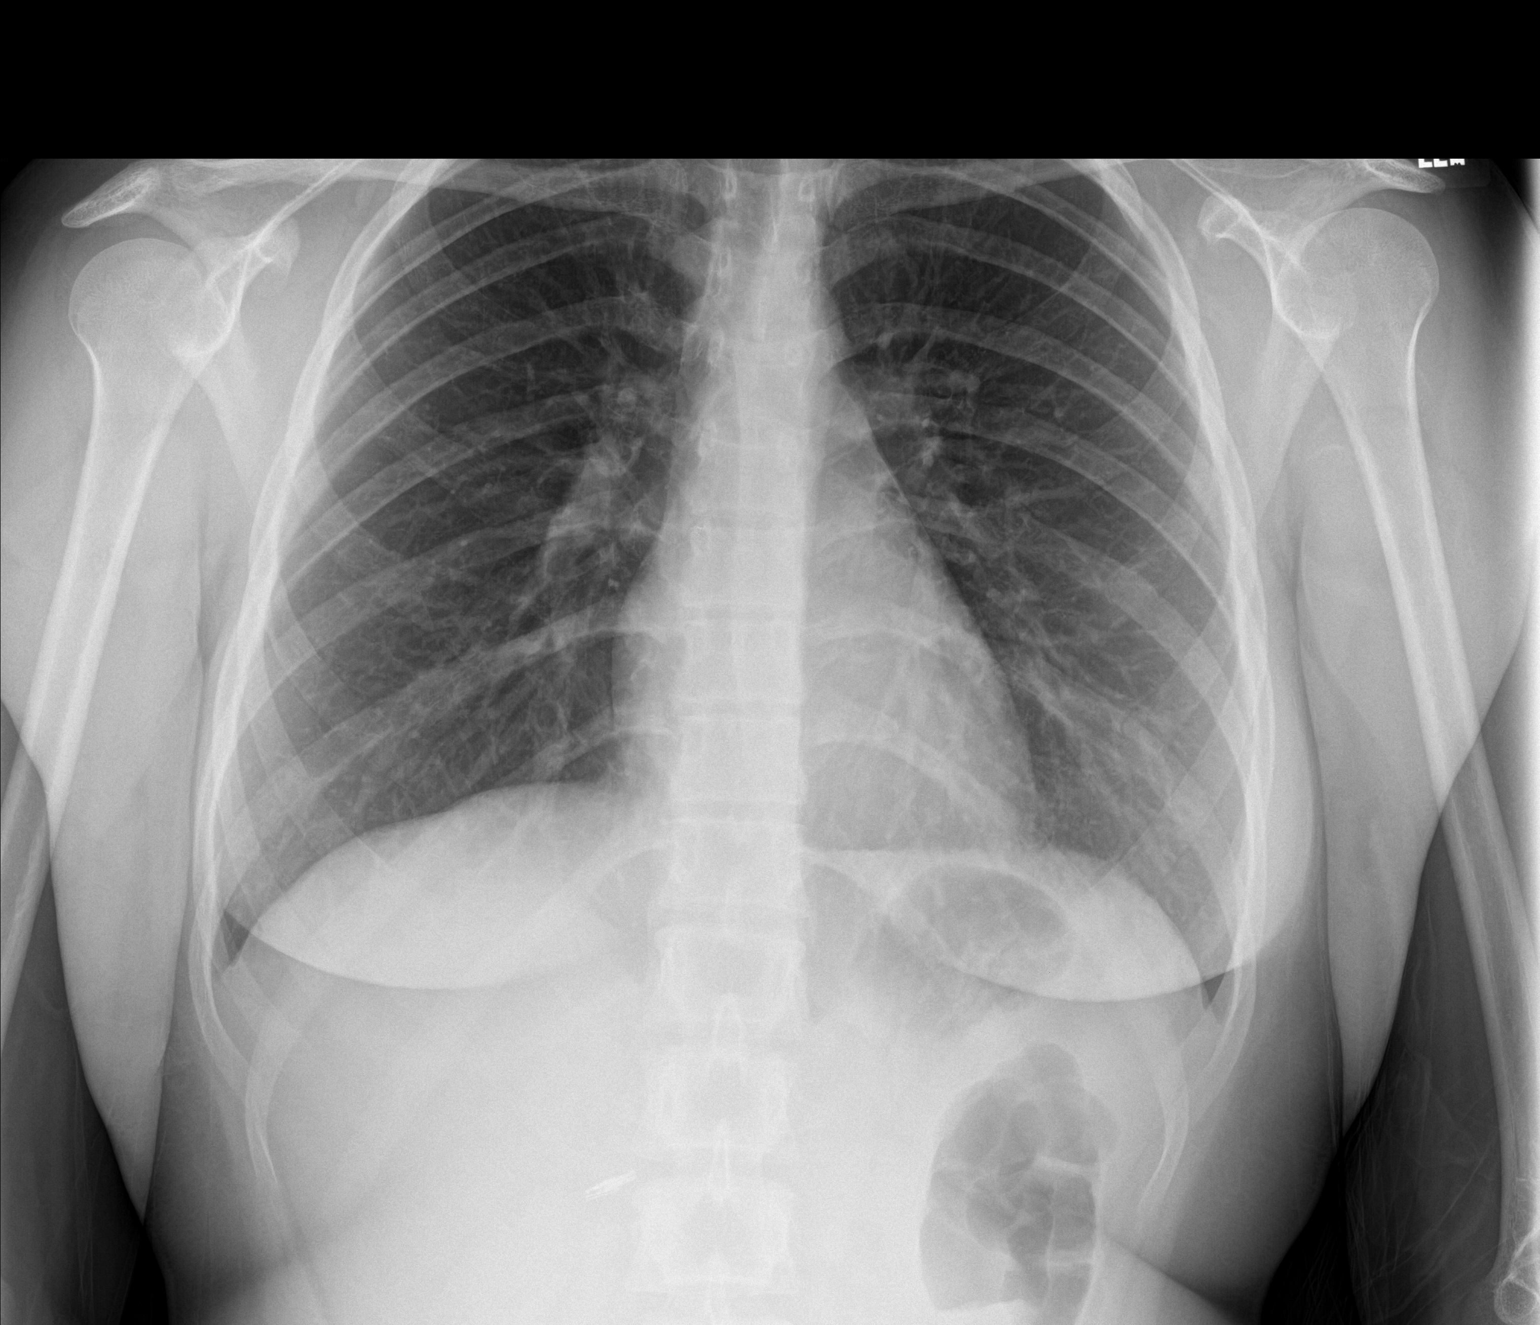
[im 2/2]
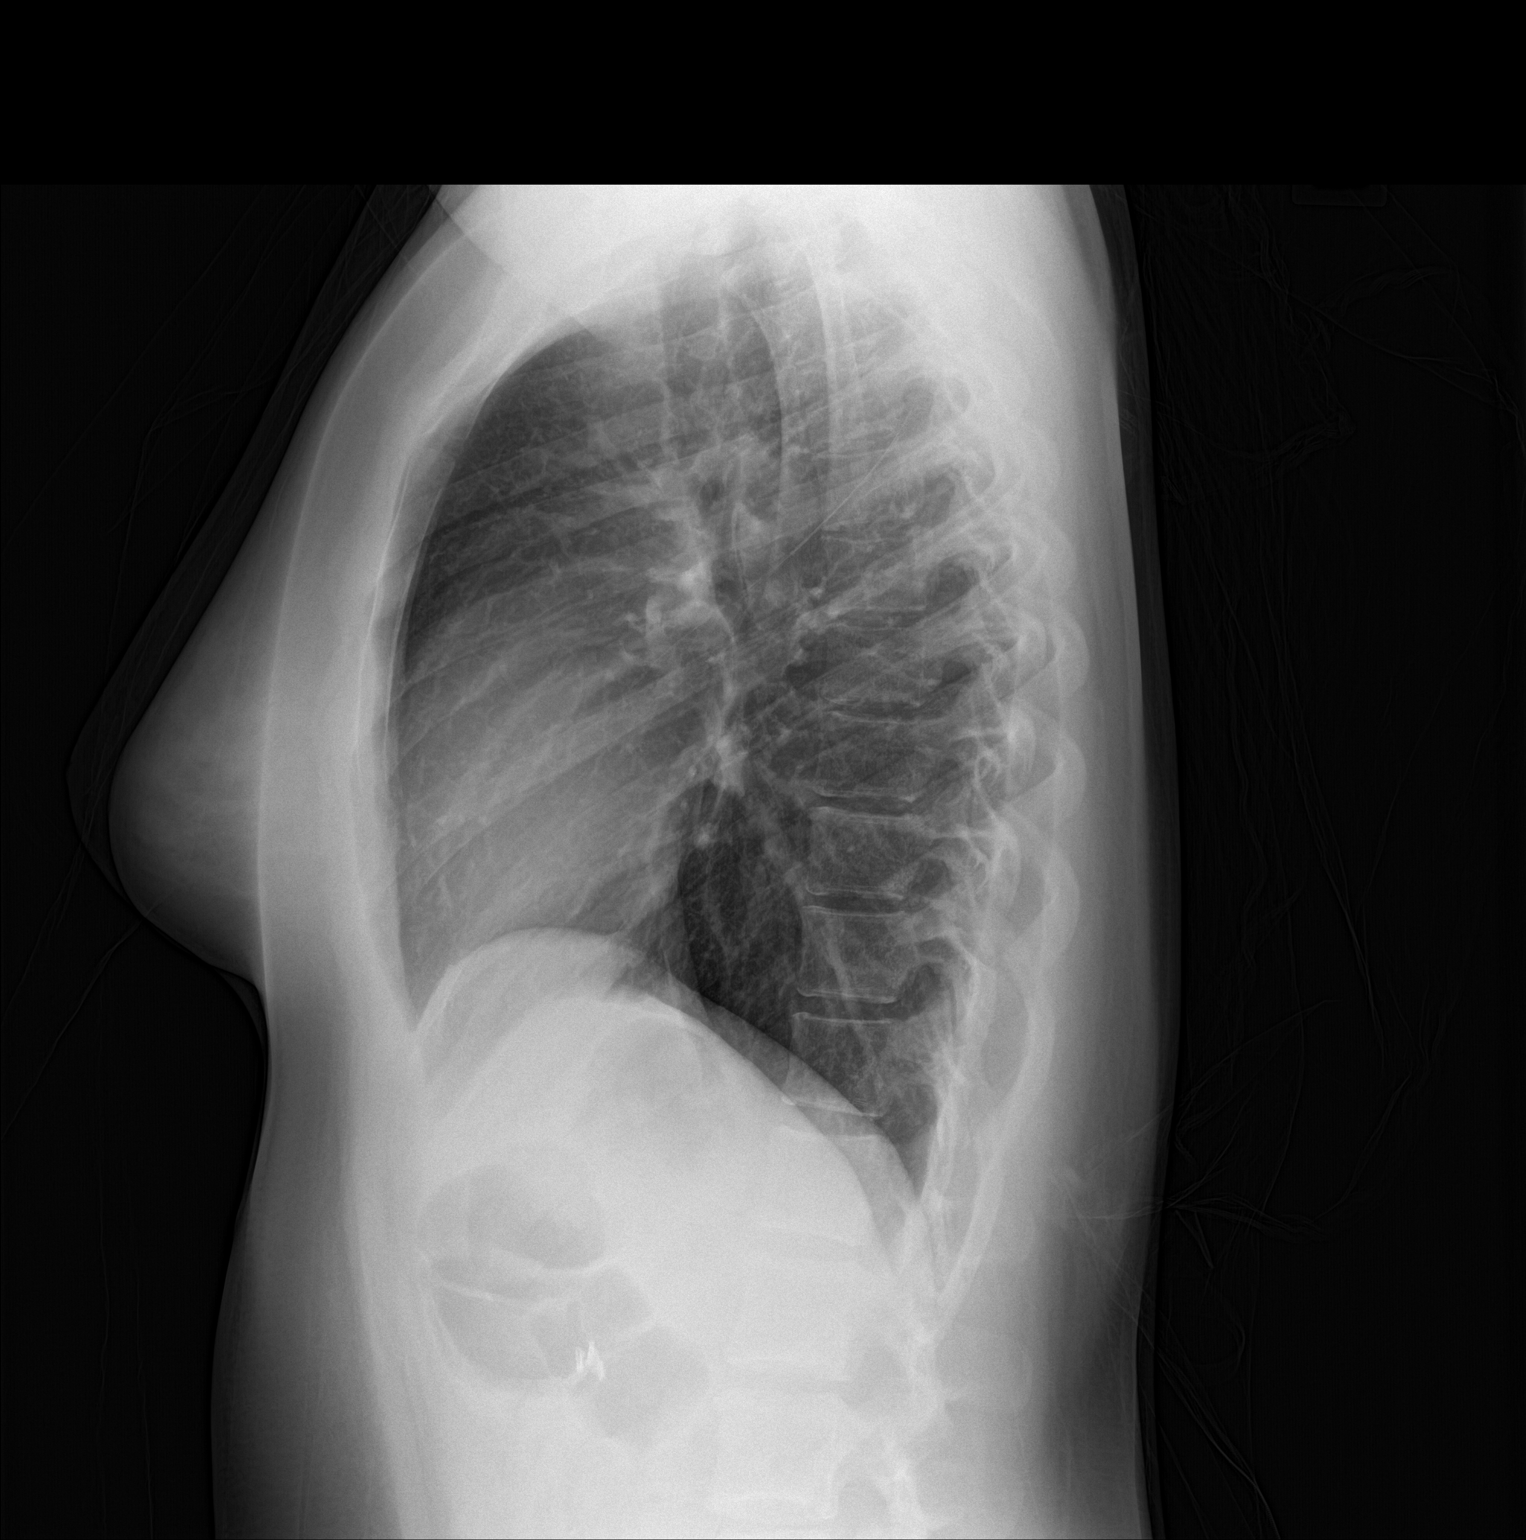

[2 of 2 positions shown; findings below may reference images not displayed]

FINDINGS: The heart size and mediastinal contours are within normal limits.
Both lungs are clear. The visualized skeletal structures are
unremarkable.
IMPRESSION: No active cardiopulmonary disease.

## 2017-02-22 ENCOUNTER — Other Ambulatory Visit: Payer: Self-pay | Admitting: Gastroenterology

## 2017-05-20 ENCOUNTER — Other Ambulatory Visit: Payer: Self-pay | Admitting: Internal Medicine

## 2017-05-20 DIAGNOSIS — R1084 Generalized abdominal pain: Secondary | ICD-10-CM

## 2017-05-24 ENCOUNTER — Ambulatory Visit: Admission: RE | Admit: 2017-05-24 | Payer: BLUE CROSS/BLUE SHIELD | Source: Ambulatory Visit

## 2017-08-20 ENCOUNTER — Other Ambulatory Visit: Payer: Self-pay | Admitting: Internal Medicine

## 2017-08-20 DIAGNOSIS — R112 Nausea with vomiting, unspecified: Secondary | ICD-10-CM

## 2017-08-20 DIAGNOSIS — R1013 Epigastric pain: Secondary | ICD-10-CM

## 2017-08-20 DIAGNOSIS — R197 Diarrhea, unspecified: Secondary | ICD-10-CM

## 2017-09-09 ENCOUNTER — Ambulatory Visit: Admission: RE | Admit: 2017-09-09 | Payer: BLUE CROSS/BLUE SHIELD | Source: Ambulatory Visit

## 2017-09-20 ENCOUNTER — Emergency Department: Payer: BLUE CROSS/BLUE SHIELD

## 2017-09-20 ENCOUNTER — Emergency Department
Admission: EM | Admit: 2017-09-20 | Discharge: 2017-09-20 | Disposition: A | Discharge status: Home / Self Care | Payer: BLUE CROSS/BLUE SHIELD | Attending: Emergency Medicine | Admitting: Emergency Medicine

## 2017-09-20 DIAGNOSIS — Y999 Unspecified external cause status: Secondary | ICD-10-CM | POA: Insufficient documentation

## 2017-09-20 DIAGNOSIS — S9031XA Contusion of right foot, initial encounter: Secondary | ICD-10-CM | POA: Diagnosis not present

## 2017-09-20 DIAGNOSIS — Y939 Activity, unspecified: Secondary | ICD-10-CM | POA: Diagnosis not present

## 2017-09-20 DIAGNOSIS — S99921A Unspecified injury of right foot, initial encounter: Secondary | ICD-10-CM | POA: Diagnosis present

## 2017-09-20 DIAGNOSIS — F909 Attention-deficit hyperactivity disorder, unspecified type: Secondary | ICD-10-CM | POA: Diagnosis not present

## 2017-09-20 DIAGNOSIS — Z79899 Other long term (current) drug therapy: Secondary | ICD-10-CM | POA: Diagnosis not present

## 2017-09-20 DIAGNOSIS — W208XXA Other cause of strike by thrown, projected or falling object, initial encounter: Secondary | ICD-10-CM | POA: Insufficient documentation

## 2017-09-20 DIAGNOSIS — Y929 Unspecified place or not applicable: Secondary | ICD-10-CM | POA: Diagnosis not present

## 2017-09-20 MED ORDER — MELOXICAM 7.5 MG PO TABS: 7.5000 mg | ORAL_TABLET | Freq: Every day | ORAL | 1 refills | Status: AC

## 2017-09-20 NOTE — ED Notes (Signed)
Pt states that she dropped a tv stand on her right foot today. Says she cant move her toes much and it's swollen. Friends at bedside.

## 2017-09-20 NOTE — ED Provider Notes (Signed)
[facility] Emergency Department Provider Note  ____________________________________________  Time seen: Approximately 8:54 PM  I have reviewed the triage vital signs and the nursing notes.   HISTORY  Chief Complaint Foot Pain    HPI Jessica Gates is a 22 y.o. female presents to the emergency department after she dropped a TV stand on her foot. Patient reports 10 out of 10 right foot pain. She denies radiculopathy, weakness or changes in sensation of the right lower extremity. No skin compromise occurred. Patient denies coldness or pain out of proportion. No alleviating measures have been attempted.   Past Medical History:  Diagnosis Date  . ADHD (attention deficit hyperactivity disorder)   . Anxiety   . Bipolar disorder (HCC)   . Depression   . GERD (gastroesophageal reflux disease)   . Headache    MIGRAINES  . Hyperlipidemia   . Personality disorder     Patient Active Problem List   Diagnosis Date Noted  . Biliary colic   . Cholelithiasis 02/16/2016    Past Surgical History:  Procedure Laterality Date  . CHOLECYSTECTOMY N/A 02/22/2016   Procedure: LAPAROSCOPIC CHOLECYSTECTOMY WITH INTRAOPERATIVE CHOLANGIOGRAM;  Surgeon: Lattie Haw, MD;  Location: [facility] ORS;  Service: General;  Laterality: N/A;    Prior to Admission medications   Medication Sig Start Date End Date Taking? Authorizing Provider  cholestyramine (QUESTRAN) 4 g packet Take 1 packet (4 g total) by mouth 4 (four) times daily. Patient not taking: Reported on 10/31/2016 09/27/16   Midge Minium, MD  colesevelam [facility]) 625 MG tablet Take 1 tablet (625 mg total) by mouth 2 (two) times daily with a meal. 10/01/16   Midge Minium, MD  famotidine (PEPCID) 20 MG tablet Take 1 tablet (20 mg total) by mouth 2 (two) times daily. Patient not taking: Reported on 10/31/2016 09/26/16   Sharman Cheek, MD  meloxicam (MOBIC) 7.5 MG tablet Take 1 tablet (7.5 mg total) by mouth daily. 09/20/17  09/27/17  Orvil Feil, PA-C  ondansetron (ZOFRAN ODT) 4 MG disintegrating tablet Take 1 tablet (4 mg total) by mouth every 8 (eight) hours as needed for nausea or vomiting. Patient not taking: Reported on 10/31/2016 09/26/16   Sharman Cheek, MD  oxyCODONE-acetaminophen (ROXICET) 5-325 MG tablet Take 1 tablet by mouth every 6 (six) hours as needed for severe pain. Patient not taking: Reported on 10/31/2016 09/26/16   Sharman Cheek, MD    Allergies Patient has no known allergies.  Family History  Problem Relation Age of Onset  . Cholelithiasis Mother   . Cancer Mother 30       kidney  . Mental illness Mother   . Appendicitis Father   . Cancer Maternal Grandfather        lung and throat  . Diabetes Paternal Grandmother     Social History Social History  Substance Use Topics  . Smoking status: Never Smoker  . Smokeless tobacco: Never Used  . Alcohol use 1.8 oz/week    3 Shots of liquor per week     Review of Systems  Constitutional: No fever/chills Eyes: No visual changes. No discharge ENT: No upper respiratory complaints. Cardiovascular: no chest pain. Respiratory: no cough. No SOB. Gastrointestinal: No abdominal pain.  No nausea, no vomiting.  No diarrhea.  No constipation. Musculoskeletal: Patient has right foot pain.  Skin: Negative for rash, abrasions, lacerations, ecchymosis. Neurological: Negative for headaches, focal weakness or numbness.   ____________________________________________   PHYSICAL EXAM:  VITAL SIGNS: ED Triage Vitals  Enc Vitals Group     BP 09/20/17 1930 123/70     Pulse Rate 09/20/17 1930 (!) 105     Resp 09/20/17 1930 12     Temp 09/20/17 1930 98.2 F (36.8 C)     Temp Source 09/20/17 1930 Oral     SpO2 09/20/17 1930 100 %     Weight 09/20/17 1931 110 lb (49.9 kg)     Height 09/20/17 1931  (1.626 m)     Head Circumference --      Peak Flow --      Pain Score 09/20/17 1931 8     Pain Loc --      Pain Edu? --      Excl.  in GC? --      Constitutional: Alert and oriented. Well appearing and in no acute distress. Eyes: Conjunctivae are normal. PERRL. EOMI. Head: Atraumatic. Cardiovascular: Normal rate, regular rhythm. Normal S1 and S2.  Good peripheral circulation. Respiratory: Normal respiratory effort without tachypnea or retractions. Lungs CTAB. Good air entry to the bases with no decreased or absent breath sounds. Musculoskeletal: Patient is able to perform full range of motion at the right ankle. She is able to move all 5 right toes. Patient has diffuse pain with palpation over metatarsals 2 through 5. Palpable dorsalis pedis pulse, right. Neurologic:  Normal speech and language. No gross focal neurologic deficits are appreciated.  Skin:  Skin is warm, dry and intact. No rash noted. Psychiatric: Mood and affect are normal. Speech and behavior are normal. Patient exhibits appropriate insight and judgement.   ____________________________________________   LABS (all labs ordered are listed, but only abnormal results are displayed)  Labs Reviewed - No data to display ____________________________________________  EKG   ____________________________________________  RADIOLOGY Geraldo Pitter, personally viewed and evaluated these images (plain radiographs) as part of my medical decision making, as well as reviewing the written report by the radiologist.  Dg Foot Complete Right  Result Date: 09/20/2017 CLINICAL DATA:  Right foot pain after injury EXAM: RIGHT FOOT COMPLETE - 3+ VIEW COMPARISON:  None. FINDINGS: There is no evidence of fracture or dislocation. There is no evidence of arthropathy or other focal bone abnormality. Soft tissues are unremarkable. IMPRESSION: Negative. Electronically Signed   By: Jasmine Pang M.D.   On: 09/20/2017 19:50    ____________________________________________    PROCEDURES  Procedure(s) performed:    Procedures    Medications - No data to  display   ____________________________________________   INITIAL IMPRESSION / ASSESSMENT AND PLAN / ED COURSE  Pertinent labs & imaging results that were available during my care of the patient were reviewed by me and considered in my medical decision making (see chart for details).  Review of the [place] CSRS was performed in accordance of the [facility] prior to dispensing any controlled drugs.     Assessment and plan Right foot contusion Patient presents to the emergency department with right foot pain after she dropped a TV stand on her foot. Physical exam is reassuring. Patient was placed in a postop shoe in the emergency department. Ice was recommended as well as Mobic. A referral was made to podiatry, Dr. Orland Jarred. Vital signs are reassuring prior to discharge. All patient questions were answered.   ____________________________________________  FINAL CLINICAL IMPRESSION(S) / ED DIAGNOSES  Final diagnoses:  Contusion of right foot, initial encounter      NEW MEDICATIONS STARTED DURING THIS VISIT:  New Prescriptions   MELOXICAM (MOBIC) 7.5 MG TABLET  Take 1 tablet (7.5 mg total) by mouth daily.        This chart was dictated using voice recognition software/Dragon. Despite best efforts to proofread, errors can occur which can change the meaning. Any change was purely unintentional.    Orvil Feil, PA-C 09/20/17 2059    Jeanmarie Plant, MD 09/20/17 [phone]

## 2017-09-20 NOTE — ED Triage Notes (Signed)
Pt presents via POV c/o right foot pain. Reports dropping TV on foot PTA.

## 2017-09-23 ENCOUNTER — Ambulatory Visit
Admission: RE | Admit: 2017-09-23 | Discharge: 2017-09-23 | Disposition: A | Discharge status: Home / Self Care | Payer: BLUE CROSS/BLUE SHIELD | Source: Ambulatory Visit | Attending: Internal Medicine | Admitting: Internal Medicine

## 2017-09-23 ENCOUNTER — Other Ambulatory Visit: Payer: Self-pay | Admitting: Internal Medicine

## 2017-09-23 DIAGNOSIS — R197 Diarrhea, unspecified: Secondary | ICD-10-CM | POA: Insufficient documentation

## 2017-09-23 DIAGNOSIS — R1013 Epigastric pain: Secondary | ICD-10-CM | POA: Insufficient documentation

## 2017-09-23 DIAGNOSIS — R112 Nausea with vomiting, unspecified: Secondary | ICD-10-CM

## 2017-09-23 DIAGNOSIS — Z9049 Acquired absence of other specified parts of digestive tract: Secondary | ICD-10-CM | POA: Insufficient documentation

## 2017-10-10 ENCOUNTER — Other Ambulatory Visit: Payer: Self-pay | Admitting: Internal Medicine

## 2017-10-10 DIAGNOSIS — R1084 Generalized abdominal pain: Secondary | ICD-10-CM

## 2017-10-11 ENCOUNTER — Encounter: Payer: Self-pay | Admitting: Emergency Medicine

## 2017-10-11 ENCOUNTER — Emergency Department
Admission: EM | Admit: 2017-10-11 | Discharge: 2017-10-11 | Disposition: A | Discharge status: Home / Self Care | Payer: BLUE CROSS/BLUE SHIELD | Attending: Emergency Medicine | Admitting: Emergency Medicine

## 2017-10-11 DIAGNOSIS — Z79899 Other long term (current) drug therapy: Secondary | ICD-10-CM | POA: Diagnosis not present

## 2017-10-11 DIAGNOSIS — R110 Nausea: Secondary | ICD-10-CM | POA: Diagnosis not present

## 2017-10-11 DIAGNOSIS — Z9049 Acquired absence of other specified parts of digestive tract: Secondary | ICD-10-CM | POA: Diagnosis not present

## 2017-10-11 DIAGNOSIS — F609 Personality disorder, unspecified: Secondary | ICD-10-CM | POA: Diagnosis not present

## 2017-10-11 DIAGNOSIS — F419 Anxiety disorder, unspecified: Secondary | ICD-10-CM | POA: Diagnosis not present

## 2017-10-11 DIAGNOSIS — R109 Unspecified abdominal pain: Secondary | ICD-10-CM | POA: Insufficient documentation

## 2017-10-11 DIAGNOSIS — F909 Attention-deficit hyperactivity disorder, unspecified type: Secondary | ICD-10-CM | POA: Insufficient documentation

## 2017-10-11 DIAGNOSIS — F319 Bipolar disorder, unspecified: Secondary | ICD-10-CM | POA: Diagnosis not present

## 2017-10-11 LAB — CBC
HCT: 39.1 % (ref 35.0–47.0)
Hemoglobin: 13.6 g/dL (ref 12.0–16.0)
MCH: 31.7 pg (ref 26.0–34.0)
MCHC: 34.7 g/dL (ref 32.0–36.0)
MCV: 91.5 fL (ref 80.0–100.0)
PLATELETS: 217 10*3/uL (ref 150–440)
RBC: 4.27 MIL/uL (ref 3.80–5.20)
RDW: 13.3 % (ref 11.5–14.5)
WBC: 4.5 10*3/uL (ref 3.6–11.0)

## 2017-10-11 LAB — COMPREHENSIVE METABOLIC PANEL
ALT: 24 U/L (ref 14–54)
AST: 22 U/L (ref 15–41)
Albumin: 4.2 g/dL (ref 3.5–5.0)
Alkaline Phosphatase: 64 U/L (ref 38–126)
Anion gap: 5 (ref 5–15)
BUN: 13 mg/dL (ref 6–20)
CHLORIDE: 108 mmol/L (ref 101–111)
CO2: 26 mmol/L (ref 22–32)
CREATININE: 0.67 mg/dL (ref 0.44–1.00)
Calcium: 9.2 mg/dL (ref 8.9–10.3)
Glucose, Bld: 96 mg/dL (ref 65–99)
Potassium: 4 mmol/L (ref 3.5–5.1)
Sodium: 139 mmol/L (ref 135–145)
Total Bilirubin: 1 mg/dL (ref 0.3–1.2)
Total Protein: 7.5 g/dL (ref 6.5–8.1)

## 2017-10-11 LAB — LIPASE, BLOOD: LIPASE: 19 U/L (ref 11–51)

## 2017-10-11 MED ORDER — ONDANSETRON HCL 4 MG/2ML IJ SOLN
4.0000 mg | Freq: Once | INTRAMUSCULAR | Status: AC
  Administered 2017-10-11: 4 mg via INTRAVENOUS
  Filled 2017-10-11: qty 2

## 2017-10-11 MED ORDER — KETOROLAC TROMETHAMINE 30 MG/ML IJ SOLN
30.0000 mg | Freq: Once | INTRAMUSCULAR | Status: AC
  Administered 2017-10-11: 30 mg via INTRAVENOUS
  Filled 2017-10-11: qty 1

## 2017-10-11 MED ORDER — SODIUM CHLORIDE 0.9 % IV BOLUS (SEPSIS)
1000.0000 mL | Freq: Once | INTRAVENOUS | Status: AC
  Administered 2017-10-11: 1000 mL via INTRAVENOUS

## 2017-10-11 MED ORDER — PROMETHAZINE HCL 25 MG PO TABS: 25.0000 mg | ORAL_TABLET | Freq: Four times a day (QID) | ORAL | 0 refills | Status: DC | PRN

## 2017-10-11 NOTE — ED Provider Notes (Signed)
[facility] Emergency Department Provider Note  Time seen: 7:45 AM  I have reviewed the triage vital signs and the nursing notes.   HISTORY  Chief Complaint Abdominal Pain    HPI Jessica Gates is a 22 y.o. female With a past medical history of bipolar, ADHD, chronic abdominal pain, presents the emergency department with abdominal pain and nausea and vomiting. According to the patient since February 2017 when she had her gallbladder removed she has had chronic abdominal pain nausea, vomiting, and occasional diarrhea. Patient states she has to GI doctors 1 here and one at [facility]. States they have an endoscopy under sedation scheduled. Patient denies any fever. No dysuria. States he abdominal pain is moderate aching pain diffusely throughout the entire abdomen.  when asked why the patient came to the emergency department today, she states she was tired of dealing with the pain.  Past Medical History:  Diagnosis Date  . ADHD (attention deficit hyperactivity disorder)   . Anxiety   . Bipolar disorder (HCC)   . Depression   . GERD (gastroesophageal reflux disease)   . Headache    MIGRAINES  . Hyperlipidemia   . Personality disorder [facility])     Patient Active Problem List   Diagnosis Date Noted  . Biliary colic   . Cholelithiasis 02/16/2016    Past Surgical History:  Procedure Laterality Date  . CHOLECYSTECTOMY N/A 02/22/2016   Procedure: LAPAROSCOPIC CHOLECYSTECTOMY WITH INTRAOPERATIVE CHOLANGIOGRAM;  Surgeon: Lattie Haw, MD;  Location: [facility] ORS;  Service: General;  Laterality: N/A;    Prior to Admission medications   Medication Sig Start Date End Date Taking? Authorizing Provider  cholestyramine (QUESTRAN) 4 g packet Take 1 packet (4 g total) by mouth 4 (four) times daily. Patient not taking: Reported on 10/31/2016 09/27/16   Midge Minium, MD  colesevelam [facility]) 625 MG tablet Take 1 tablet (625 mg total) by mouth 2 (two) times daily with a meal.  10/01/16   Midge Minium, MD  famotidine (PEPCID) 20 MG tablet Take 1 tablet (20 mg total) by mouth 2 (two) times daily. Patient not taking: Reported on 10/31/2016 09/26/16   Sharman Cheek, MD  ondansetron (ZOFRAN ODT) 4 MG disintegrating tablet Take 1 tablet (4 mg total) by mouth every 8 (eight) hours as needed for nausea or vomiting. Patient not taking: Reported on 10/31/2016 09/26/16   Sharman Cheek, MD  oxyCODONE-acetaminophen (ROXICET) 5-325 MG tablet Take 1 tablet by mouth every 6 (six) hours as needed for severe pain. Patient not taking: Reported on 10/31/2016 09/26/16   Sharman Cheek, MD    No Known Allergies  Family History  Problem Relation Age of Onset  . Cholelithiasis Mother   . Cancer Mother 30       kidney  . Mental illness Mother   . Appendicitis Father   . Cancer Maternal Grandfather        lung and throat  . Diabetes Paternal Grandmother     Social History Social History  Substance Use Topics  . Smoking status: Never Smoker  . Smokeless tobacco: Never Used  . Alcohol use 1.8 oz/week    3 Shots of liquor per week    Review of Systems Constitutional: Negative for fever. Cardiovascular: Negative for chest pain. Respiratory: Negative for shortness of breath. Gastrointestinal: positive abdominal pain. Positive nausea and vomiting and diarrhea Genitourinary: Negative for dysuria.currently on her menstrual period Neurological: Negative for headache All other ROS negative  ____________________________________________   PHYSICAL EXAM:  VITAL SIGNS:  ED Triage Vitals  Enc Vitals Group     BP 10/11/17 0722 110/64     Pulse Rate 10/11/17 0722 78     Resp 10/11/17 0722 18     Temp 10/11/17 0722 97.8 F (36.6 C)     Temp Source 10/11/17 0722 Oral     SpO2 10/11/17 0722 100 %     Weight 10/11/17 0723 110 lb (49.9 kg)     Height 10/11/17 0723  (1.651 m)     Head Circumference --      Peak Flow --      Pain Score 10/11/17 0728 8     Pain Loc --       Pain Edu? --      Excl. in GC? --     Constitutional: Alert and oriented. Well appearing and in no distress. Eyes: Normal exam ENT   Head: Normocephalic and atraumatic.   Mouth/Throat: Mucous membranes are moist. Cardiovascular: Normal rate, regular rhythm. No murmur Respiratory: Normal respiratory effort without tachypnea nor retractions. Breath sounds are clear Gastrointestinal: soft, mild to moderate tenderness, diffusely across the entire abdomen. No focal tenderness identified. Musculoskeletal: Nontender with normal range of motion in all extremities.  Neurologic:  Normal speech and language. No gross focal neurologic deficits  Skin:  Skin is warm, dry and intact.  Psychiatric: Mood and affect are normal.   ____________________________________________     INITIAL IMPRESSION / ASSESSMENT AND PLAN / ED COURSE  Pertinent labs & imaging results that were available during my care of the patient were reviewed by me and considered in my medical decision making (see chart for details).  patient presents to the emergency department for diffuse abdominal pain nausea and vomiting. Patient states her symptoms have been ongoing 1.5 years. Denies any acute worsening today, states she is tired of dealing with the pain and nausea so she came to the emergency department. Patient has 2 GI physicians that she follows up with. I reviewed the patient's records, this does appear to be a chronic issue, her PCP Dr. Judithann Sheen attempted to obtain an upper GI series recently per the patient could not tolerate the barium. Differential this time would include chronic abdominal pain, cyclical vomiting, gastritis, upper GI pathology such as ulcerations. We will check labs, treat pain, nausea, IV hydrate while awaiting lab results.  patient's labs are largely within normal limits. States she does not have to urinate and does not want to wait for a urine sample. Patient states she is feeling somewhat better.  Requesting her IV be discontinued. We'll discharge from the emergency department with GI follow-up.  ____________________________________________   FINAL CLINICAL IMPRESSION(S) / ED DIAGNOSES  abdominal pain    Minna Antis, MD 10/11/17 1030

## 2017-10-11 NOTE — ED Triage Notes (Signed)
Pt s/p cholecystectomy 2/17 with chronic NV, diarrhea, abd pain here today for increase of same. folled by [facility] and [facility] for same.

## 2017-10-15 ENCOUNTER — Ambulatory Visit
Admission: RE | Admit: 2017-10-15 | Discharge: 2017-10-15 | Disposition: A | Discharge status: Home / Self Care | Payer: BLUE CROSS/BLUE SHIELD | Source: Ambulatory Visit | Attending: Internal Medicine | Admitting: Internal Medicine

## 2017-12-09 IMAGING — US US ABDOMEN LIMITED
1 series · 14 of 25 positions shown · non-contrast
Comparison: None.

CLINICAL DATA: Right upper quadrant pain for 2 months.

EXAM:
US ABDOMEN LIMITED - RIGHT UPPER QUADRANT

[Series 1: us abdomen limited · 0.17mm/px · 14 of 46 slices shown]
[im 1/46]
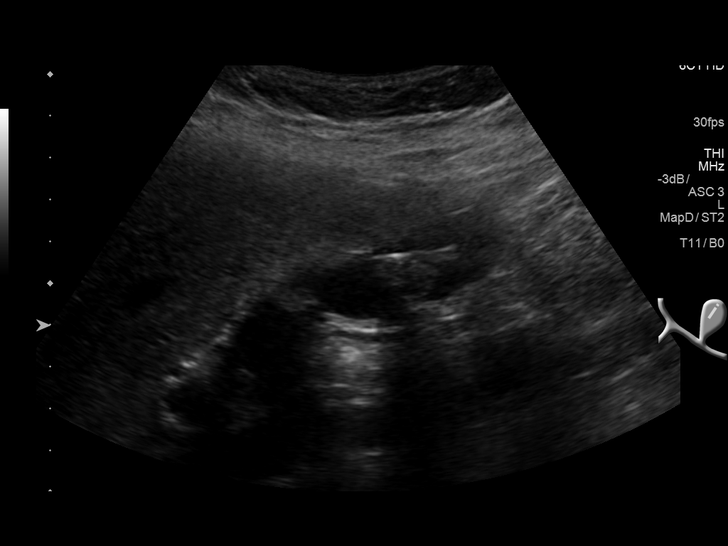
[im 4/46]
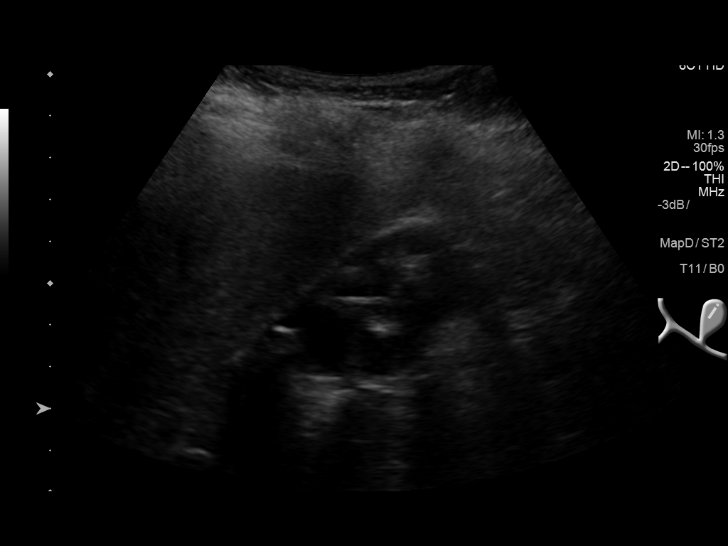
[im 8/46]
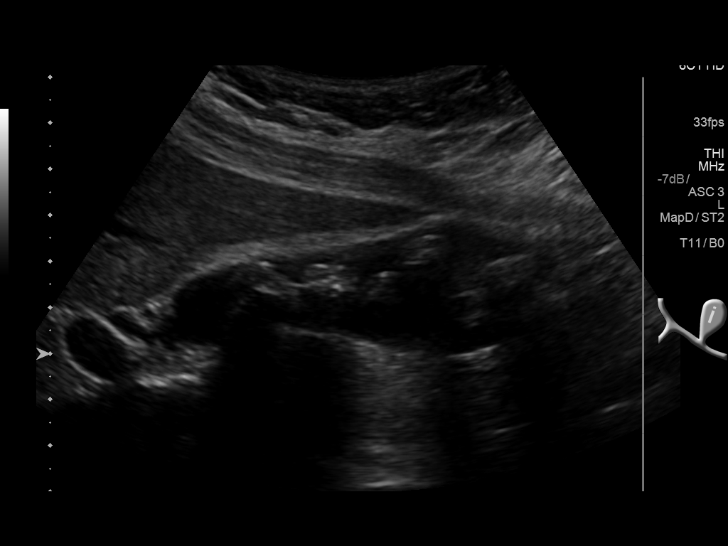
[im 12/46]
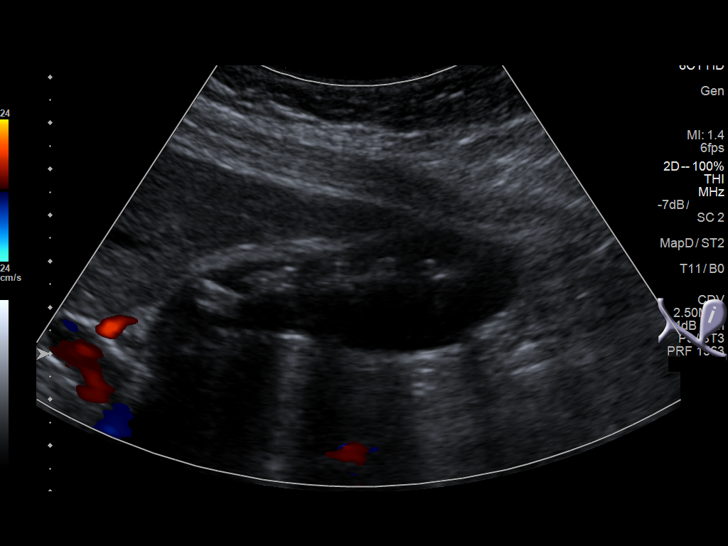
[im 16/46]
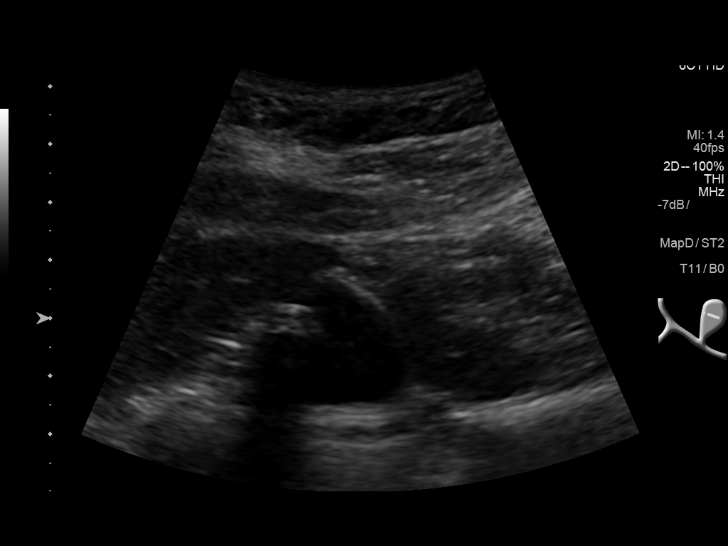
[im 17/46]
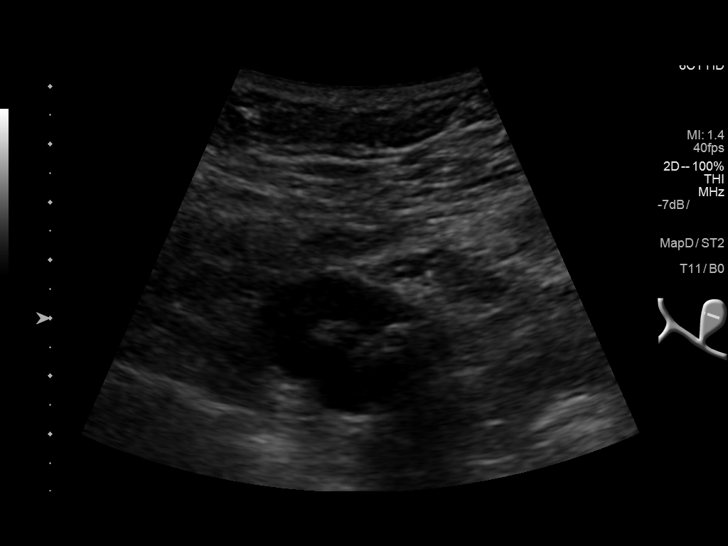
[im 21/46]
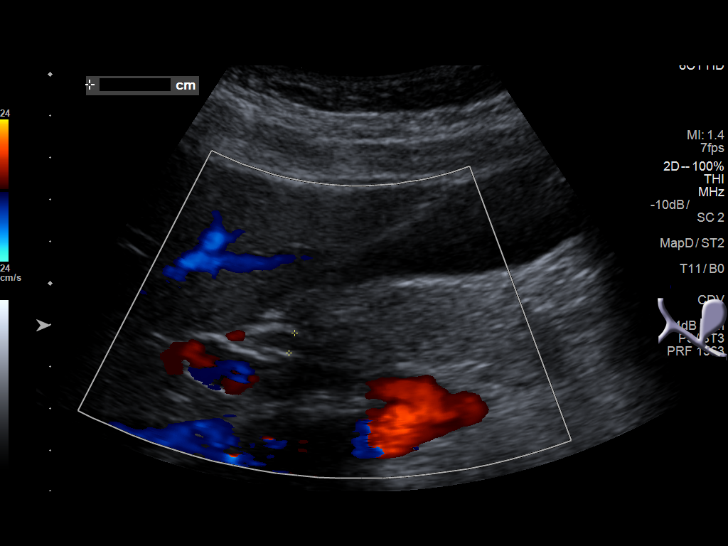
[im 25/46]
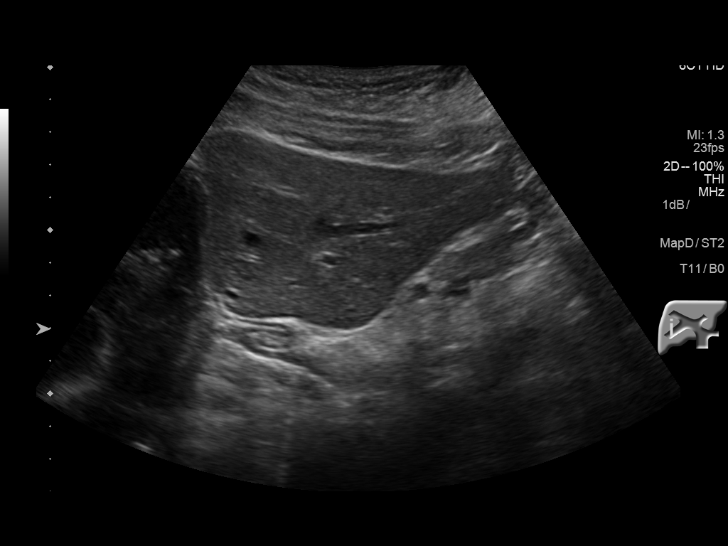
[im 29/46]
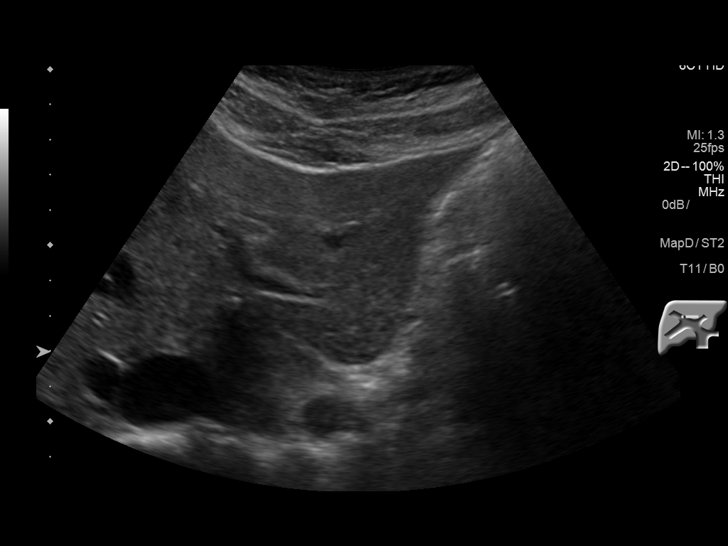
[im 31/46]
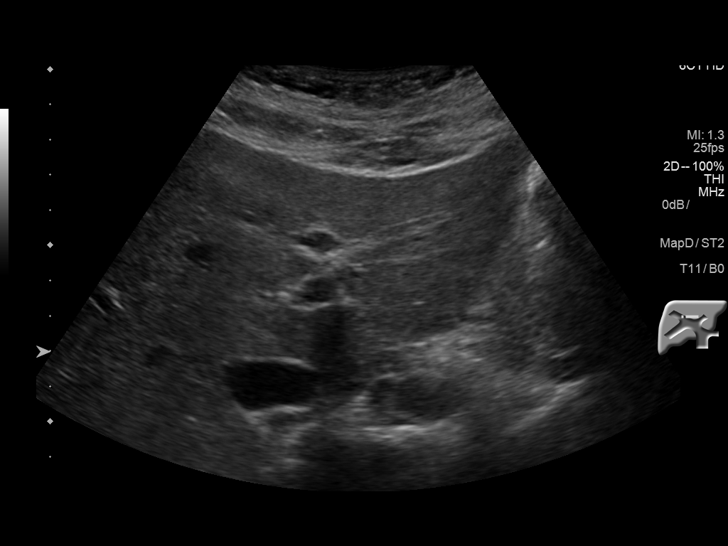
[im 34/46]
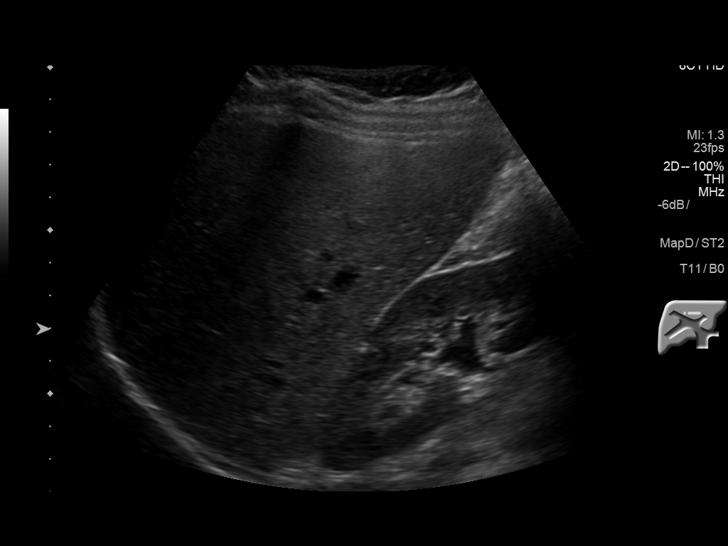
[im 38/46]
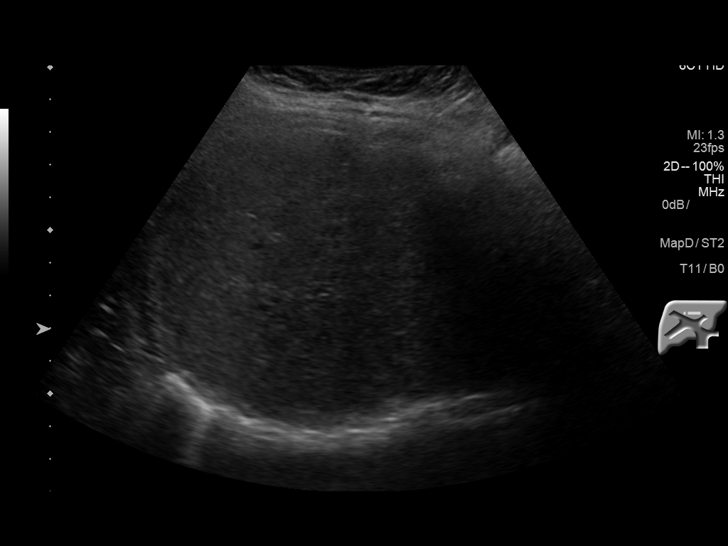
[im 42/46]
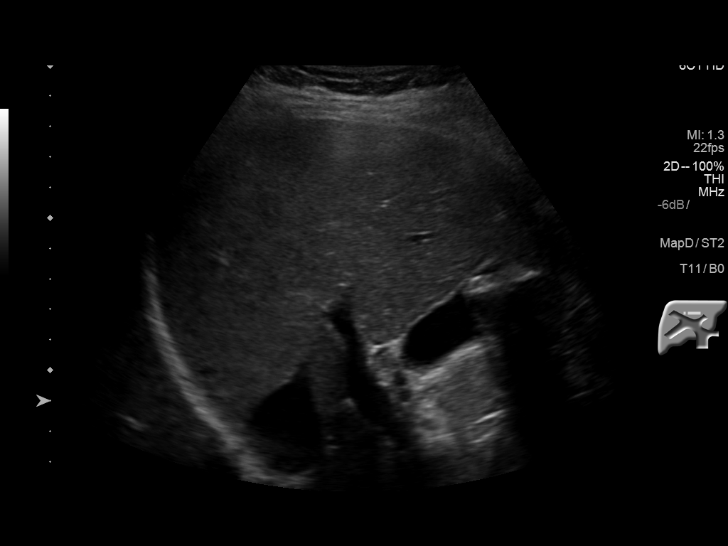
[im 46/46]
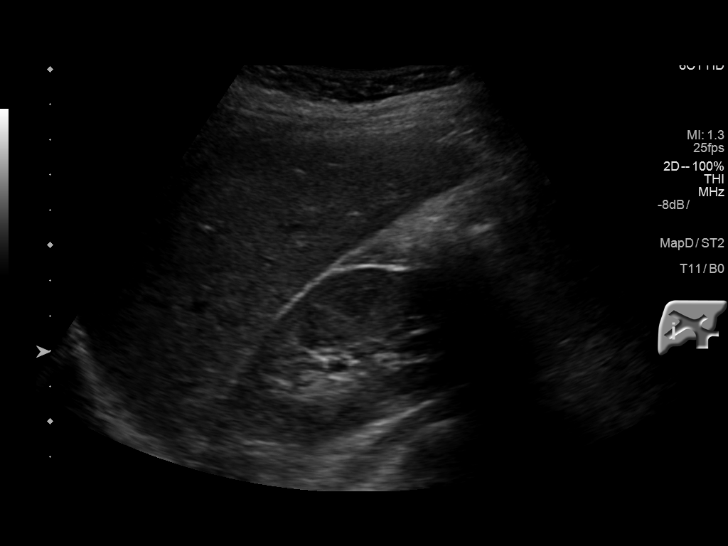

[14 of 25 positions shown; findings below may reference images not displayed]

FINDINGS: Gallbladder:

The gallbladder is incompletely distended. There are multiple large
mobile gallstones, the largest of which measures 1.9 cm in
long-axis. There is an associated gallbladder wall thickening,
measuring up to 5.3 mm. No pericholecystic fluid is seen.
Sonographic Murphy's sign was reported as negative.

Common bile duct:

Diameter: Normal, measuring 3.1 to 5 mm.

Liver:

No focal lesion identified. Within normal limits in parenchymal
echogenicity.
IMPRESSION: Several large gallstones with associated gallbladder wall
thickening, which may be due to incomplete distention of the
gallbladder or in the appropriate clinical setting may represent an
acute cholecystitis.

Normal caliber of the extrahepatic common bile duct.

Normal appearance of the liver.

## 2018-02-09 ENCOUNTER — Emergency Department
Admission: EM | Admit: 2018-02-09 | Discharge: 2018-02-09 | Discharge status: Against Medical Advice | Payer: BLUE CROSS/BLUE SHIELD

## 2018-02-09 NOTE — ED Notes (Signed)
Pt called in the WR with no response 

## 2018-05-05 ENCOUNTER — Emergency Department (HOSPITAL_BASED_OUTPATIENT_CLINIC_OR_DEPARTMENT_OTHER): Payer: BLUE CROSS/BLUE SHIELD

## 2018-05-05 ENCOUNTER — Emergency Department (HOSPITAL_BASED_OUTPATIENT_CLINIC_OR_DEPARTMENT_OTHER)
Admission: EM | Admit: 2018-05-05 | Discharge: 2018-05-05 | Disposition: A | Discharge status: Home / Self Care | Payer: BLUE CROSS/BLUE SHIELD | Attending: Emergency Medicine | Admitting: Emergency Medicine

## 2018-05-05 ENCOUNTER — Other Ambulatory Visit: Payer: Self-pay

## 2018-05-05 ENCOUNTER — Encounter (HOSPITAL_BASED_OUTPATIENT_CLINIC_OR_DEPARTMENT_OTHER): Payer: Self-pay | Admitting: Emergency Medicine

## 2018-05-05 DIAGNOSIS — M25531 Pain in right wrist: Secondary | ICD-10-CM | POA: Diagnosis not present

## 2018-05-05 DIAGNOSIS — M25552 Pain in left hip: Secondary | ICD-10-CM | POA: Insufficient documentation

## 2018-05-05 DIAGNOSIS — Z79899 Other long term (current) drug therapy: Secondary | ICD-10-CM | POA: Diagnosis not present

## 2018-05-05 DIAGNOSIS — Y92410 Unspecified street and highway as the place of occurrence of the external cause: Secondary | ICD-10-CM | POA: Diagnosis not present

## 2018-05-05 DIAGNOSIS — Y9389 Activity, other specified: Secondary | ICD-10-CM | POA: Diagnosis not present

## 2018-05-05 DIAGNOSIS — M25562 Pain in left knee: Secondary | ICD-10-CM | POA: Insufficient documentation

## 2018-05-05 DIAGNOSIS — M549 Dorsalgia, unspecified: Secondary | ICD-10-CM | POA: Diagnosis not present

## 2018-05-05 DIAGNOSIS — M25571 Pain in right ankle and joints of right foot: Secondary | ICD-10-CM | POA: Insufficient documentation

## 2018-05-05 DIAGNOSIS — M25512 Pain in left shoulder: Secondary | ICD-10-CM | POA: Diagnosis not present

## 2018-05-05 DIAGNOSIS — Y998 Other external cause status: Secondary | ICD-10-CM | POA: Insufficient documentation

## 2018-05-05 DIAGNOSIS — S0990XA Unspecified injury of head, initial encounter: Secondary | ICD-10-CM | POA: Diagnosis not present

## 2018-05-05 DIAGNOSIS — S299XXA Unspecified injury of thorax, initial encounter: Secondary | ICD-10-CM | POA: Insufficient documentation

## 2018-05-05 MED ORDER — IOPAMIDOL (ISOVUE-300) INJECTION 61%
100.0000 mL | Freq: Once | INTRAVENOUS | Status: AC | PRN
  Administered 2018-05-05: 80 mL via INTRAVENOUS

## 2018-05-05 MED ORDER — IBUPROFEN 800 MG PO TABS: 800.0000 mg | ORAL_TABLET | Freq: Three times a day (TID) | ORAL | 0 refills | Status: DC

## 2018-05-05 MED ORDER — IBUPROFEN 800 MG PO TABS
800.0000 mg | ORAL_TABLET | Freq: Once | ORAL | Status: AC
  Administered 2018-05-05: 800 mg via ORAL
  Filled 2018-05-05: qty 1

## 2018-05-05 MED ORDER — METHOCARBAMOL 500 MG PO TABS: 500.0000 mg | ORAL_TABLET | Freq: Every evening | ORAL | 0 refills | Status: DC | PRN

## 2018-05-05 MED FILL — IBUPROFEN 800 MG TAB: 800 | 7 days supply | Qty: 21 | Fill #0

## 2018-05-05 MED FILL — METHOCARBAMOL 500 MG TABLET: 500 | 20 days supply | Qty: 20 | Fill #0
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
## 2018-05-05 NOTE — ED Triage Notes (Signed)
Pt also c/o lower back pain.    No numbness in legs or feet.

## 2018-10-02 ENCOUNTER — Emergency Department
Admission: EM | Admit: 2018-10-02 | Discharge: 2018-10-02 | Disposition: A | Discharge status: Home / Self Care | Payer: BLUE CROSS/BLUE SHIELD | Attending: Emergency Medicine | Admitting: Emergency Medicine

## 2018-10-02 ENCOUNTER — Encounter: Payer: Self-pay | Admitting: Emergency Medicine

## 2018-10-02 DIAGNOSIS — R197 Diarrhea, unspecified: Secondary | ICD-10-CM | POA: Diagnosis not present

## 2018-10-02 DIAGNOSIS — R1084 Generalized abdominal pain: Secondary | ICD-10-CM | POA: Diagnosis present

## 2018-10-02 DIAGNOSIS — R112 Nausea with vomiting, unspecified: Secondary | ICD-10-CM | POA: Insufficient documentation

## 2018-10-02 LAB — COMPREHENSIVE METABOLIC PANEL
ALBUMIN: 4 g/dL (ref 3.5–5.0)
ALT: 23 U/L (ref 0–44)
ANION GAP: 10 (ref 5–15)
AST: 16 U/L (ref 15–41)
Alkaline Phosphatase: 74 U/L (ref 38–126)
BUN: 14 mg/dL (ref 6–20)
CO2: 26 mmol/L (ref 22–32)
Calcium: 9.3 mg/dL (ref 8.9–10.3)
Chloride: 102 mmol/L (ref 98–111)
Creatinine, Ser: 0.77 mg/dL (ref 0.44–1.00)
GFR calc non Af Amer: >60 mL/min (ref >60)
Glucose, Bld: 98 mg/dL (ref 70–99)
Potassium: 3.6 mmol/L (ref 3.5–5.1)
SODIUM: 138 mmol/L (ref 135–145)
Total Bilirubin: 1 mg/dL (ref 0.3–1.2)
Total Protein: 7.1 g/dL (ref 6.5–8.1)

## 2018-10-02 LAB — CBC
HCT: 41 % (ref 35.0–47.0)
HEMOGLOBIN: 14.6 g/dL (ref 12.0–16.0)
MCH: 33.3 pg (ref 26.0–34.0)
MCHC: 35.7 g/dL (ref 32.0–36.0)
MCV: 93.4 fL (ref 80.0–100.0)
Platelets: 244 10*3/uL (ref 150–440)
RBC: 4.39 MIL/uL (ref 3.80–5.20)
RDW: 12.6 % (ref 11.5–14.5)
WBC: 5.6 10*3/uL (ref 3.6–11.0)

## 2018-10-02 LAB — LIPASE, BLOOD: LIPASE: 20 U/L (ref 11–51)

## 2018-10-02 NOTE — ED Triage Notes (Signed)
Pt reports mid abd pain that started yesterday that is crampy in nature and constant. Pt reports NV as well. States has diarrhea but that has been normal for her since her gallbladder was removed 2 years ago. Denies urinary sx's.

## 2019-01-11 ENCOUNTER — Other Ambulatory Visit: Payer: Self-pay

## 2019-01-11 ENCOUNTER — Emergency Department
Admission: EM | Admit: 2019-01-11 | Discharge: 2019-01-11 | Disposition: A | Discharge status: Home / Self Care | Payer: BLUE CROSS/BLUE SHIELD | Attending: Emergency Medicine | Admitting: Emergency Medicine

## 2019-01-11 ENCOUNTER — Emergency Department: Payer: BLUE CROSS/BLUE SHIELD

## 2019-01-11 DIAGNOSIS — J101 Influenza due to other identified influenza virus with other respiratory manifestations: Secondary | ICD-10-CM | POA: Insufficient documentation

## 2019-01-11 DIAGNOSIS — Z79899 Other long term (current) drug therapy: Secondary | ICD-10-CM | POA: Insufficient documentation

## 2019-01-11 DIAGNOSIS — J189 Pneumonia, unspecified organism: Secondary | ICD-10-CM | POA: Diagnosis not present

## 2019-01-11 DIAGNOSIS — E785 Hyperlipidemia, unspecified: Secondary | ICD-10-CM | POA: Insufficient documentation

## 2019-01-11 DIAGNOSIS — R079 Chest pain, unspecified: Secondary | ICD-10-CM | POA: Diagnosis present

## 2019-01-11 LAB — BASIC METABOLIC PANEL
Anion gap: 7 (ref 5–15)
BUN: 14 mg/dL (ref 6–20)
CALCIUM: 9.2 mg/dL (ref 8.9–10.3)
CO2: 27 mmol/L (ref 22–32)
Chloride: 104 mmol/L (ref 98–111)
Creatinine, Ser: 0.74 mg/dL (ref 0.44–1.00)
GFR calc Af Amer: >60 mL/min (ref >60)
GFR calc non Af Amer: >60 mL/min (ref >60)
GLUCOSE: 97 mg/dL (ref 70–99)
Potassium: 3.5 mmol/L (ref 3.5–5.1)
Sodium: 138 mmol/L (ref 135–145)

## 2019-01-11 LAB — CBC
HCT: 44.4 % (ref 36.0–46.0)
Hemoglobin: 15.1 g/dL — ABNORMAL HIGH (ref 12.0–15.0)
MCH: 31.3 pg (ref 26.0–34.0)
MCHC: 34 g/dL (ref 30.0–36.0)
MCV: 91.9 fL (ref 80.0–100.0)
PLATELETS: 208 10*3/uL (ref 150–400)
RBC: 4.83 MIL/uL (ref 3.87–5.11)
RDW: 12.4 % (ref 11.5–15.5)
WBC: 3.2 10*3/uL — ABNORMAL LOW (ref 4.0–10.5)
nRBC: 0 % (ref 0.0–0.2)

## 2019-01-11 LAB — INFLUENZA PANEL BY PCR (TYPE A & B)
Influenza A By PCR: NEGATIVE
Influenza B By PCR: POSITIVE — AB

## 2019-01-11 LAB — TROPONIN I: Troponin I: <0.03 ng/mL

## 2019-01-11 LAB — FIBRIN DERIVATIVES D-DIMER (ARMC ONLY): FIBRIN DERIVATIVES D-DIMER (ARMC): 216.62 ng{FEU}/mL (ref 0.00–499.00)

## 2019-01-11 MED ORDER — AZITHROMYCIN 500 MG PO TABS
500.0000 mg | ORAL_TABLET | Freq: Once | ORAL | Status: AC
  Administered 2019-01-11: 500 mg via ORAL
  Filled 2019-01-11: qty 1

## 2019-01-11 MED ORDER — OSELTAMIVIR PHOSPHATE 75 MG PO CAPS: 75.0000 mg | ORAL_CAPSULE | Freq: Two times a day (BID) | ORAL | 0 refills | Status: AC

## 2019-01-11 MED ORDER — IPRATROPIUM-ALBUTEROL 0.5-2.5 (3) MG/3ML IN SOLN
3.0000 mL | Freq: Once | RESPIRATORY_TRACT | Status: AC
  Administered 2019-01-11: 3 mL via RESPIRATORY_TRACT
  Filled 2019-01-11: qty 3

## 2019-01-11 MED ORDER — AZITHROMYCIN 250 MG PO TABS: ORAL_TABLET | ORAL | 0 refills | Status: DC

## 2019-01-11 MED ORDER — OSELTAMIVIR PHOSPHATE 75 MG PO CAPS
75.0000 mg | ORAL_CAPSULE | Freq: Once | ORAL | Status: AC
  Administered 2019-01-11: 75 mg via ORAL
  Filled 2019-01-11: qty 1

## 2019-01-11 MED ORDER — KETOROLAC TROMETHAMINE 30 MG/ML IJ SOLN
15.0000 mg | Freq: Once | INTRAMUSCULAR | Status: AC
  Administered 2019-01-11: 15 mg via INTRAVENOUS
  Filled 2019-01-11: qty 1

## 2019-01-11 MED ORDER — SODIUM CHLORIDE 0.9 % IV BOLUS
1000.0000 mL | Freq: Once | INTRAVENOUS | Status: AC
  Administered 2019-01-11: 1000 mL via INTRAVENOUS

## 2019-01-11 NOTE — ED Triage Notes (Signed)
Pt comes via POV from home with c/o chest pain that she states feels like is closing, generalized body aches and dizziness.  Pt states central chest pain that radiates to her neck.  Pt denies any N/V and unsure of any fevers.

## 2019-01-11 NOTE — Discharge Instructions (Signed)

## 2019-01-11 NOTE — ED Provider Notes (Signed)
[facility] Emergency Department Provider Note  ____________________________________________  Time seen: Approximately 2:41 PM  I have reviewed the triage vital signs and the nursing notes.   HISTORY  Chief Complaint Chest Pain and Dizziness   HPI Jessica Gates is a 24 y.o. female with a history of ADHD, bipolar, GERD, migraines who presents for evaluation of chest pain and cough.  Patient reports several days of a dry cough which has become productive overnight.  She is complaining of tightness across her chest worse on the left side that is present every time she coughs.  She has had mild shortness of breath with exertion.  She is complaining of diffuse body aches and chills.  No fever.  She has had nausea but no vomiting, no diarrhea.  No personal family history of blood clots, recent travel immobilization, leg pain or swelling, hemoptysis, or exogenous hormones.  Patient denies history of smoking.  Past Medical History:  Diagnosis Date  . ADHD (attention deficit hyperactivity disorder)   . Anxiety   . Bipolar disorder (HCC)   . Depression   . GERD (gastroesophageal reflux disease)   . Headache    MIGRAINES  . Hyperlipidemia   . Personality disorder [facility](HCC)     Patient Active Problem List   Diagnosis Date Noted  . Biliary colic   . Cholelithiasis 02/16/2016    Past Surgical History:  Procedure Laterality Date  . CHOLECYSTECTOMY N/A 02/22/2016   Procedure: LAPAROSCOPIC CHOLECYSTECTOMY WITH INTRAOPERATIVE CHOLANGIOGRAM;  Surgeon: Lattie Hawichard E Cooper, MD;  Location: [facility] ORS;  Service: General;  Laterality: N/A;    Prior to Admission medications   Medication Sig Start Date End Date Taking? Authorizing Provider  ALPRAZolam Prudy Feeler(XANAX) 0.5 MG tablet Take 0.5 mg by mouth daily as needed for anxiety.    [provider]  ARIPiprazole (ABILIFY) 20 MG tablet Take 20 mg by mouth daily.     [provider]  azithromycin (ZITHROMAX) 250 MG  tablet Take 1 a day for 4 days 01/11/19   Don PerkingVeronese, [place], MD  cholestyramine Lanetta Inch(QUESTRAN) 4 g packet Take 1 packet (4 g total) by mouth 4 (four) times daily. Patient not taking: Reported on 10/31/2016 09/27/16   Midge MiniumWohl, Darren, MD  colesevelam [facility](WELCHOL) 625 MG tablet Take 1 tablet (625 mg total) by mouth 2 (two) times daily with a meal. Patient not taking: Reported on 10/11/2017 10/01/16   Midge MiniumWohl, Darren, MD  famotidine (PEPCID) 20 MG tablet Take 1 tablet (20 mg total) by mouth 2 (two) times daily. Patient not taking: Reported on 10/31/2016 09/26/16   Sharman CheekStafford, Phillip, MD  ibuprofen (ADVIL,MOTRIN) 800 MG tablet Take 1 tablet (800 mg total) by mouth 3 (three) times daily. 05/05/18   Georgiana ShoreMitchell, Jessica B, PA-C  lansoprazole (PREVACID) 30 MG capsule Take 30 mg by mouth daily. 10/09/17   [provider]  methocarbamol (ROBAXIN) 500 MG tablet Take 1 tablet (500 mg total) by mouth at bedtime as needed. 05/05/18   Mathews RobinsonsMitchell, Jessica B, PA-C  ondansetron (ZOFRAN ODT) 4 MG disintegrating tablet Take 1 tablet (4 mg total) by mouth every 8 (eight) hours as needed for nausea or vomiting. Patient not taking: Reported on 10/31/2016 09/26/16   Sharman CheekStafford, Phillip, MD  oseltamivir (TAMIFLU) 75 MG capsule Take 1 capsule (75 mg total) by mouth 2 (two) times daily for 5 days. 01/11/19 01/16/19  Nita SickleVeronese, [place], MD  oxyCODONE-acetaminophen (ROXICET) 5-325 MG tablet Take 1 tablet by mouth every 6 (six) hours as needed for severe pain. Patient not  taking: Reported on 10/31/2016 09/26/16   Sharman Cheek, MD  promethazine (PHENERGAN) 25 MG tablet Take 1 tablet (25 mg total) by mouth every 6 (six) hours as needed for nausea or vomiting. 10/11/17   Minna Antis, MD  traZODone (DESYREL) 50 MG tablet Take 50 mg by mouth daily. with food 10/02/17   [provider]    Allergies Patient has no known allergies.  Family History  Problem Relation Age of Onset  . Cholelithiasis Mother   . Cancer Mother 30        kidney  . Mental illness Mother   . Appendicitis Father   . Cancer Maternal Grandfather        lung and throat  . Diabetes Paternal Grandmother     Social History Social History   Tobacco Use  . Smoking status: Never Smoker  . Smokeless tobacco: Never Used  Substance Use Topics  . Alcohol use: Yes    Alcohol/week: 3.0 standard drinks    Types: 3 Shots of liquor per week  . Drug use: No    Types: Marijuana    Comment: PT DENIES 02-20-16 BUT HAD A + UDS IN 2014 FOR MARIJUANA    Review of Systems  Constitutional: Negative for fever. + chills Eyes: Negative for visual changes. ENT: Negative for sore throat. Neck: No neck pain  Cardiovascular: + chest tightness Respiratory: + shortness of breath and cough Gastrointestinal: Negative for abdominal pain, vomiting or diarrhea. Genitourinary: Negative for dysuria. Musculoskeletal: Negative for back pain. Skin: Negative for rash. Neurological: Negative for headaches, weakness or numbness. Psych: No SI or HI  ____________________________________________   PHYSICAL EXAM:  VITAL SIGNS: ED Triage Vitals  Enc Vitals Group     BP 01/11/19 1146 113/75     Pulse Rate 01/11/19 1146 (!) 106     Resp 01/11/19 1146 16     Temp 01/11/19 1146 98.4 F (36.9 C)     Temp Source 01/11/19 1146 Oral     SpO2 01/11/19 1146 100 %     Weight 01/11/19 1153 130 lb (59 kg)     Height 01/11/19 1153 5\' 6"  (1.676 m)     Head Circumference --      Peak Flow --      Pain Score 01/11/19 1153 7     Pain Loc --      Pain Edu? --      Excl. in GC? --     Constitutional: Alert and oriented. Well appearing and in no apparent distress. HEENT:      Head: Normocephalic and atraumatic.         Eyes: Conjunctivae are normal. Sclera is non-icteric.       Mouth/Throat: Mucous membranes are moist.       Neck: Supple with no signs of meningismus. Cardiovascular: Tachycardic with regular rhythm. No murmurs, gallops, or rubs. 2+ symmetrical distal pulses  are present in all extremities. No JVD. Respiratory: Normal respiratory effort. Lungs are clear to auscultation bilaterally with decreased air movement bilaterally. No wheezes, crackles, or rhonchi.  Gastrointestinal: Soft, non tender, and non distended with positive bowel sounds. No rebound or guarding. Musculoskeletal: Nontender with normal range of motion in all extremities. No edema, cyanosis, or erythema of extremities. Neurologic: Normal speech and language. Face is symmetric. Moving all extremities. No gross focal neurologic deficits are appreciated. Skin: Skin is warm, dry and intact. No rash noted. Psychiatric: Mood and affect are normal. Speech and behavior are normal.  ____________________________________________   LABS (  all labs ordered are listed, but only abnormal results are displayed)  Labs Reviewed  CBC - Abnormal; Notable for the following components:      Result Value   WBC 3.2 (*)    Hemoglobin 15.1 (*)    All other components within normal limits  INFLUENZA PANEL BY PCR (TYPE A & B) - Abnormal; Notable for the following components:   Influenza B By PCR POSITIVE (*)    All other components within normal limits  BASIC METABOLIC PANEL  TROPONIN I  FIBRIN DERIVATIVES D-DIMER ([facility] ONLY)  POC URINE PREG, ED   ____________________________________________  EKG  ED ECG REPORT I, Nita Sicklearolina Kevin Mario, the attending physician, personally viewed and interpreted this ECG.  Sinus tachycardia, rate of 105, normal intervals, right axis deviation, no ST elevations or depressions.  No prior for comparison. ____________________________________________  RADIOLOGY  I have personally reviewed the images performed during this visit and I agree with the Radiologist's read.   Interpretation by Radiologist:  Dg Chest 2 View  Result Date: 01/11/2019 CLINICAL DATA:  Initial evaluation for acute chest pain. EXAM: CHEST - 2 VIEW COMPARISON:  Prior CT and radiograph from 05/05/2018.  FINDINGS: The cardiac and mediastinal silhouettes are stable in size and contour, and remain within normal limits. The lungs are normally inflated. Subtle hazy opacity partially silhouettes the mid left heart border, which could reflect atelectasis or infiltrate. No other focal airspace disease. No pulmonary edema or pleural effusion. No pneumothorax. No acute osseous abnormality. No acute osseous abnormality identified. IMPRESSION: Subtle left basilar opacity, which could reflect atelectasis or small infiltrate. Electronically Signed   By: Rise MuBenjamin  McClintock M.D.   On: 01/11/2019 14:01     ____________________________________________   PROCEDURES  Procedure(s) performed: None Procedures Critical Care performed:  None ____________________________________________   INITIAL IMPRESSION / ASSESSMENT AND PLAN / ED COURSE  24 y.o. female with a history of ADHD, bipolar, GERD, migraines who presents for evaluation of chest pain and cough.  Ddx viral URI, Flu, PNA, bronchitis. PE also possible although less likely however with tachycardia and RAD on EKG will send d-dimer.  CXR, labs pending. Will give duoneb for decreased air movement.     _________________________ 4:30 PM on 01/11/2019 -----------------------------------------  Chest x-ray concerning for left lower lobe pneumonia.  Labs positive for influenza B.  No signs of sepsis, normal work of breathing, no oxygen requirement, patient continues to look well appearing.  Patient was given azithromycin and Tamiflu.  Will discharge home with a prescription for both.  Recommended follow-up with primary care doctor.  Discussed return precautions.   As part of my medical decision making, I reviewed the following data within the electronic MEDICAL RECORD NUMBER Nursing notes reviewed and incorporated, Labs reviewed , EKG interpreted , Old chart reviewed, Radiograph reviewed , Notes from prior ED visits and [place] Controlled Substance  Database    Pertinent labs & imaging results that were available during my care of the patient were reviewed by me and considered in my medical decision making (see chart for details).    ____________________________________________   FINAL CLINICAL IMPRESSION(S) / ED DIAGNOSES  Final diagnoses:  Community acquired pneumonia, unspecified laterality  Influenza B      NEW MEDICATIONS STARTED DURING THIS VISIT:  ED Discharge Orders         Ordered    azithromycin (ZITHROMAX) 250 MG tablet     01/11/19 1628    oseltamivir (TAMIFLU) 75 MG capsule  2 times daily  01/11/19 1628           Note:  This document was prepared using Dragon voice recognition software and may include unintentional dictation errors.    Don Perking, [place], MD 01/11/19 [phone]

## 2019-02-07 ENCOUNTER — Other Ambulatory Visit: Payer: Self-pay

## 2019-02-07 ENCOUNTER — Emergency Department: Payer: BLUE CROSS/BLUE SHIELD

## 2019-02-07 DIAGNOSIS — Z5321 Procedure and treatment not carried out due to patient leaving prior to being seen by health care provider: Secondary | ICD-10-CM | POA: Insufficient documentation

## 2019-02-07 DIAGNOSIS — R079 Chest pain, unspecified: Secondary | ICD-10-CM | POA: Insufficient documentation

## 2019-02-07 NOTE — ED Notes (Signed)
Pt taken to triage 1; pt texting with both hands going through door; says she can't move her right side so someone will have to help her take off her coat; pt then proceeded to lift her right arm for someone to assist her; ED tech Isabelle Course assisting patient

## 2019-02-07 NOTE — ED Triage Notes (Signed)
Patient reports having pain just under right breast for several days that is causing shortness of breath.  Patient denies any type of injury.

## 2019-02-08 ENCOUNTER — Emergency Department
Admission: EM | Admit: 2019-02-08 | Discharge: 2019-02-08 | Discharge status: Against Medical Advice | Payer: BLUE CROSS/BLUE SHIELD | Attending: Emergency Medicine | Admitting: Emergency Medicine

## 2019-09-28 ENCOUNTER — Encounter: Payer: Self-pay | Admitting: Emergency Medicine

## 2019-09-28 ENCOUNTER — Emergency Department
Admission: EM | Admit: 2019-09-28 | Discharge: 2019-09-28 | Disposition: A | Discharge status: Home / Self Care | Payer: BLUE CROSS/BLUE SHIELD | Attending: Emergency Medicine | Admitting: Emergency Medicine

## 2019-09-28 ENCOUNTER — Other Ambulatory Visit: Payer: Self-pay

## 2019-09-28 DIAGNOSIS — Z5321 Procedure and treatment not carried out due to patient leaving prior to being seen by health care provider: Secondary | ICD-10-CM | POA: Insufficient documentation

## 2019-09-28 DIAGNOSIS — R1013 Epigastric pain: Secondary | ICD-10-CM | POA: Insufficient documentation

## 2019-09-28 LAB — CBC
HCT: 41.9 % (ref 36.0–46.0)
Hemoglobin: 14.5 g/dL (ref 12.0–15.0)
MCH: 32.1 pg (ref 26.0–34.0)
MCHC: 34.6 g/dL (ref 30.0–36.0)
MCV: 92.7 fL (ref 80.0–100.0)
Platelets: 291 10*3/uL (ref 150–400)
RBC: 4.52 MIL/uL (ref 3.87–5.11)
RDW: 12.3 % (ref 11.5–15.5)
WBC: 6.9 10*3/uL (ref 4.0–10.5)
nRBC: 0 % (ref 0.0–0.2)

## 2019-09-28 LAB — COMPREHENSIVE METABOLIC PANEL
ALT: 13 U/L (ref 0–44)
AST: 14 U/L — ABNORMAL LOW (ref 15–41)
Albumin: 4.5 g/dL (ref 3.5–5.0)
Alkaline Phosphatase: 76 U/L (ref 38–126)
Anion gap: 7 (ref 5–15)
BUN: 19 mg/dL (ref 6–20)
CO2: 28 mmol/L (ref 22–32)
Calcium: 10 mg/dL (ref 8.9–10.3)
Chloride: 103 mmol/L (ref 98–111)
Creatinine, Ser: 0.8 mg/dL (ref 0.44–1.00)
GFR calc Af Amer: >60 mL/min (ref >60)
GFR calc non Af Amer: >60 mL/min (ref >60)
Glucose, Bld: 86 mg/dL (ref 70–99)
Potassium: 4.6 mmol/L (ref 3.5–5.1)
Sodium: 138 mmol/L (ref 135–145)
Total Bilirubin: 0.6 mg/dL (ref 0.3–1.2)
Total Protein: 7.4 g/dL (ref 6.5–8.1)

## 2019-09-28 LAB — URINALYSIS, COMPLETE (UACMP) WITH MICROSCOPIC
Bacteria, UA: NONE SEEN
Bilirubin Urine: NEGATIVE
Glucose, UA: NEGATIVE mg/dL
Ketones, ur: NEGATIVE mg/dL
Nitrite: NEGATIVE
Specific Gravity, Urine: 1.025 (ref 1.005–1.030)
pH: 7 (ref 5.0–8.0)

## 2019-09-28 LAB — LIPASE, BLOOD: Lipase: 19 U/L (ref 11–51)

## 2019-09-28 LAB — POCT PREGNANCY, URINE: Preg Test, Ur: NEGATIVE

## 2019-09-28 MED ORDER — SODIUM CHLORIDE 0.9% FLUSH: 3.0000 mL | Freq: Once | INTRAVENOUS | Status: DC

## 2019-09-28 NOTE — ED Notes (Signed)
Pt reports "I'm leaving to take my kids to daycare, I'll go to my doctor's office"  No IV placed

## 2019-09-28 NOTE — ED Triage Notes (Signed)
Patient ambulatory to triage with complaints of "liver hurting" points to epigastric area for 2 days.  Intermittent burning pain 8/10.  Pt reports N/V x 2/chills/sweating. Pt reports taking tylenol 1500  yesterday.  Speaking in complete coherent sentences. No acute breathing distress noted.   Pt hx of cholecystectomy 3 years prior and since then [facility] has treated pt for "liver problems" denies hepatitis

## 2019-09-29 ENCOUNTER — Other Ambulatory Visit: Payer: Self-pay

## 2019-09-29 ENCOUNTER — Encounter: Payer: Self-pay | Admitting: Intensive Care

## 2019-09-29 ENCOUNTER — Emergency Department
Admission: EM | Admit: 2019-09-29 | Discharge: 2019-09-29 | Discharge status: Against Medical Advice | Payer: Self-pay | Attending: Emergency Medicine | Admitting: Emergency Medicine

## 2019-09-29 DIAGNOSIS — R112 Nausea with vomiting, unspecified: Secondary | ICD-10-CM | POA: Insufficient documentation

## 2019-09-29 DIAGNOSIS — Z5321 Procedure and treatment not carried out due to patient leaving prior to being seen by health care provider: Secondary | ICD-10-CM | POA: Insufficient documentation

## 2019-09-29 HISTORY — DX: Obsessive-compulsive disorder, unspecified: F42.9

## 2019-09-29 LAB — CBC
HCT: 42.2 % (ref 36.0–46.0)
Hemoglobin: 14.4 g/dL (ref 12.0–15.0)
MCH: 32.2 pg (ref 26.0–34.0)
MCHC: 34.1 g/dL (ref 30.0–36.0)
MCV: 94.4 fL (ref 80.0–100.0)
Platelets: 296 10*3/uL (ref 150–400)
RBC: 4.47 MIL/uL (ref 3.87–5.11)
RDW: 12.3 % (ref 11.5–15.5)
WBC: 5 10*3/uL (ref 4.0–10.5)
nRBC: 0 % (ref 0.0–0.2)

## 2019-09-29 LAB — COMPREHENSIVE METABOLIC PANEL
ALT: 11 U/L (ref 0–44)
AST: 15 U/L (ref 15–41)
Albumin: 4.5 g/dL (ref 3.5–5.0)
Alkaline Phosphatase: 68 U/L (ref 38–126)
Anion gap: 7 (ref 5–15)
BUN: 15 mg/dL (ref 6–20)
CO2: 27 mmol/L (ref 22–32)
Calcium: 9.6 mg/dL (ref 8.9–10.3)
Chloride: 102 mmol/L (ref 98–111)
Creatinine, Ser: 0.76 mg/dL (ref 0.44–1.00)
GFR calc Af Amer: >60 mL/min (ref >60)
GFR calc non Af Amer: >60 mL/min (ref >60)
Glucose, Bld: 91 mg/dL (ref 70–99)
Potassium: 4.3 mmol/L (ref 3.5–5.1)
Sodium: 136 mmol/L (ref 135–145)
Total Bilirubin: 0.7 mg/dL (ref 0.3–1.2)
Total Protein: 7.2 g/dL (ref 6.5–8.1)

## 2019-09-29 LAB — LIPASE, BLOOD: Lipase: 20 U/L (ref 11–51)

## 2019-09-29 MED ORDER — ACETAMINOPHEN 500 MG PO TABS
ORAL_TABLET | ORAL | Status: AC
  Filled 2019-09-29: qty 2

## 2019-09-29 MED ORDER — ACETAMINOPHEN 500 MG PO TABS
1000.0000 mg | ORAL_TABLET | Freq: Once | ORAL | Status: AC
  Administered 2019-09-29: 1000 mg via ORAL

## 2019-09-29 NOTE — ED Triage Notes (Signed)
Patient c/o "liver" pain. Was seen here X2 days ago but left before being seen. Reports N/V. Denies diarrhea. Reports last BM X3 days ago and feels constipated.

## 2019-09-29 NOTE — ED Notes (Signed)
Pt requesting tylenol to first nurse.  Tylenol given per dr Archie Balboa verbal order.

## 2019-10-22 ENCOUNTER — Other Ambulatory Visit: Payer: Self-pay

## 2019-10-22 ENCOUNTER — Emergency Department
Admission: EM | Admit: 2019-10-22 | Discharge: 2019-10-22 | Discharge status: Against Medical Advice | Payer: BC Managed Care – PPO
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
## 2020-05-31 ENCOUNTER — Emergency Department: Payer: BC Managed Care – PPO

## 2020-05-31 ENCOUNTER — Other Ambulatory Visit: Payer: Self-pay

## 2020-05-31 ENCOUNTER — Encounter: Payer: Self-pay | Admitting: *Deleted

## 2020-05-31 ENCOUNTER — Emergency Department
Admission: EM | Admit: 2020-05-31 | Discharge: 2020-05-31 | Disposition: A | Discharge status: Home / Self Care | Payer: BC Managed Care – PPO | Attending: Emergency Medicine | Admitting: Emergency Medicine

## 2020-05-31 DIAGNOSIS — Y9389 Activity, other specified: Secondary | ICD-10-CM | POA: Insufficient documentation

## 2020-05-31 DIAGNOSIS — S40022A Contusion of left upper arm, initial encounter: Secondary | ICD-10-CM | POA: Diagnosis not present

## 2020-05-31 DIAGNOSIS — S4992XA Unspecified injury of left shoulder and upper arm, initial encounter: Secondary | ICD-10-CM | POA: Insufficient documentation

## 2020-05-31 DIAGNOSIS — S0990XA Unspecified injury of head, initial encounter: Secondary | ICD-10-CM | POA: Diagnosis present

## 2020-05-31 DIAGNOSIS — S40021A Contusion of right upper arm, initial encounter: Secondary | ICD-10-CM | POA: Diagnosis not present

## 2020-05-31 DIAGNOSIS — S022XXA Fracture of nasal bones, initial encounter for closed fracture: Secondary | ICD-10-CM | POA: Insufficient documentation

## 2020-05-31 DIAGNOSIS — S0083XA Contusion of other part of head, initial encounter: Secondary | ICD-10-CM | POA: Insufficient documentation

## 2020-05-31 DIAGNOSIS — Y999 Unspecified external cause status: Secondary | ICD-10-CM | POA: Diagnosis not present

## 2020-05-31 DIAGNOSIS — S4991XA Unspecified injury of right shoulder and upper arm, initial encounter: Secondary | ICD-10-CM | POA: Diagnosis not present

## 2020-05-31 DIAGNOSIS — Y929 Unspecified place or not applicable: Secondary | ICD-10-CM | POA: Insufficient documentation

## 2020-05-31 MED ORDER — TRAMADOL HCL 50 MG PO TABS: 50.0000 mg | ORAL_TABLET | Freq: Four times a day (QID) | ORAL | 0 refills | Status: DC | PRN
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
## 2020-05-31 NOTE — ED Notes (Signed)
E-signature not working at this time. Pt verbalized understanding of D/C instructions, prescriptions and follow up care with no further questions at this time. Pt in NAD and wheeled to lobby at time of D/C.  
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
## 2020-08-08 ENCOUNTER — Ambulatory Visit: Payer: Self-pay

## 2020-10-10 ENCOUNTER — Encounter: Payer: Self-pay | Admitting: Family Medicine

## 2020-10-10 ENCOUNTER — Ambulatory Visit (INDEPENDENT_AMBULATORY_CARE_PROVIDER_SITE_OTHER): Payer: BC Managed Care – PPO | Admitting: Family Medicine

## 2020-10-10 ENCOUNTER — Other Ambulatory Visit: Payer: Self-pay

## 2020-10-10 VITALS — BP 103/61 | HR 73 | Temp 98.5°F | Resp 18 | Ht 64.5 in | Wt 153.8 lb

## 2020-10-10 DIAGNOSIS — F32A Depression, unspecified: Secondary | ICD-10-CM

## 2020-10-10 DIAGNOSIS — F909 Attention-deficit hyperactivity disorder, unspecified type: Secondary | ICD-10-CM | POA: Insufficient documentation

## 2020-10-10 DIAGNOSIS — Z7689 Persons encountering health services in other specified circumstances: Secondary | ICD-10-CM | POA: Diagnosis not present

## 2020-10-10 DIAGNOSIS — F419 Anxiety disorder, unspecified: Secondary | ICD-10-CM | POA: Diagnosis not present

## 2020-10-10 DIAGNOSIS — J302 Other seasonal allergic rhinitis: Secondary | ICD-10-CM | POA: Diagnosis not present

## 2020-10-10 MED ORDER — FEXOFENADINE HCL 180 MG PO TABS: 180.0000 mg | ORAL_TABLET | Freq: Every day | ORAL | 1 refills | Status: DC

## 2020-10-10 NOTE — Assessment & Plan Note (Signed)
See depression A/P. 

## 2020-10-10 NOTE — Assessment & Plan Note (Signed)
New patient establishment to [facility] for primary care services.  Reports was previously seen by Dr. Judithann Sheen with [facility], no access to Care Everywhere for records review, will request medical records.  Follows with [facility] since she was 10 for reported Bipolar, is unaware if 1 or 2.  Plan: 1. RTC in 2-3 months for CPE

## 2020-10-10 NOTE — Patient Instructions (Signed)
I have sent in a prescription for Allegra, to take 1 tablet daily to help with seasonal allergies and post-nasal drip.  This should help your nighttime cough resolve.   We will plan to see you back in 3 months for your physical and PAP testing  You will receive a survey after today's visit either digitally by e-mail or paper by USPS mail. Your experiences and feedback matter to [place].  Please respond so we know how we are doing as we provide care for you.  Call us with any questions/concerns/needs.  It is my goal to be available to you for your health concerns.  Thanks for choosing me to be a partner in your healthcare needs!  Charlaine Dalton, FNP-C Family Nurse Practitioner [facility] Phone: [phone]

## 2020-10-10 NOTE — Progress Notes (Signed)
Subjective:    Patient ID: Jessica Gates, female    DOB: 11/27/1995, 25 y.o.   MRN: [id number]  Jessica Gates is a 25 y.o. female presenting on 10/10/2020 for Establish Care (pt developed a cough x 2 days after getting her COVID vaccine on 09/16/2020. She describe it as a dry cough.)   HPI  Jessica Gates presents to clinic for new patient establishment at [facility] for primary care.  Previous PCP was Jessica Gates at [facility].  Records will be requested, as not available through Care Everywhere.  Past medical, family, and surgical history reviewed w/ pt.  [name] has acute concerns for cough that started approximately 2 days after her COVID vaccine (Vaccine 09/16/2020).  States is a dry cough, has been attributing this to being a smoker and vaping.  Denies fevers, sore throat, change in taste/smell, SOB, DOE, CP, abdominal pain, n/v/d.  Reports cough is present only when she lays down.  Has not been exposed to anyone that has been sick.   Depression screen PHQ 2/9 10/10/2020  Decreased Interest 2  Down, Depressed, Hopeless 3  PHQ - 2 Score 5  Altered sleeping 1  Tired, decreased energy 1  Change in appetite 2  Feeling bad or failure about yourself  3  Trouble concentrating 1  Moving slowly or fidgety/restless 2  Suicidal thoughts 0  PHQ-9 Score 15  Difficult doing work/chores Not difficult at all    Social History   Tobacco Use  . Smoking status: Never Smoker  . Smokeless tobacco: Never Used  Vaping Use  . Vaping Use: Some days  . Substances: Nicotine, Flavoring  Substance Use Topics  . Alcohol use: Never  . Drug use: Yes    Types: Marijuana    Review of Systems  Constitutional: Negative.   HENT: Negative.   Eyes: Negative.   Respiratory: Positive for cough. Negative for apnea, choking, chest tightness, shortness of breath, wheezing and stridor.   Cardiovascular: Negative.   Gastrointestinal: Negative.   Endocrine: Negative.   Genitourinary: Negative.     Musculoskeletal: Negative.   Skin: Negative.   Allergic/Immunologic: Negative.   Neurological: Negative.   Hematological: Negative.   Psychiatric/Behavioral: Negative.    Per HPI unless specifically indicated above     Objective:    BP 103/61 (BP Location: Left Arm, Patient Position: Sitting, Cuff Size: Normal)   Pulse 73   Temp 98.5 F (36.9 C) (Oral)   Resp 18   Ht 5' 4.5" (1.638 m)   Wt 153 lb 12.8 oz (69.8 kg)   LMP 09/30/2020   SpO2 100%   BMI 25.99 kg/m   Wt Readings from Last 3 Encounters:  10/10/20 153 lb 12.8 oz (69.8 kg)    Physical Exam Vitals and nursing note reviewed.  Constitutional:      General: She is not in acute distress.    Appearance: Normal appearance. She is well-developed and well-groomed. She is not ill-appearing or toxic-appearing.  HENT:     Head: Normocephalic and atraumatic.     Nose:     Comments: Lesia Sago is in place, covering mouth and nose. Eyes:     General: Lids are normal. Vision grossly intact.        Right eye: No discharge.        Left eye: No discharge.     Extraocular Movements: Extraocular movements intact.     Conjunctiva/sclera: Conjunctivae normal.     Pupils: Pupils are equal, round, and reactive to light.  Cardiovascular:  Rate and Rhythm: Normal rate and regular rhythm.     Pulses: Normal pulses.          Dorsalis pedis pulses are 2+ on the right side and 2+ on the left side.     Heart sounds: Normal heart sounds. No murmur heard.  No friction rub. No gallop.   Pulmonary:     Effort: Pulmonary effort is normal. No respiratory distress.     Breath sounds: Normal breath sounds.  Musculoskeletal:     Right lower leg: No edema.     Left lower leg: No edema.  Skin:    General: Skin is warm and dry.     Capillary Refill: Capillary refill takes less than 2 seconds.  Neurological:     General: No focal deficit present.     Mental Status: She is alert and oriented to person, place, and time.  Psychiatric:         Attention and Perception: Attention and perception normal.        Mood and Affect: Mood and affect normal.        Speech: Speech normal.        Behavior: Behavior normal. Behavior is cooperative.        Thought Content: Thought content normal.        Cognition and Memory: Cognition and memory normal.        Judgment: Judgment normal.    No results found for this or any previous visit.    Assessment & Plan:   Problem List Items Addressed This Visit      Other   Encounter to establish care with new doctor - Primary    New patient establishment to [facility] for primary care services.  Reports was previously seen by Jessica Gates with [facility], no access to Care Everywhere for records review, will request medical records.  Follows with [facility] Psychiatry since she was 10 for reported Bipolar, is unaware if 1 or 2.  Plan: 1. RTC in 2-3 months for CPE      Anxiety    See depression A/P      Depression    PHQ9-15/GAD7-9.  Currently on Abilify 400mg  IM monthly injection.  Reports follows with [facility] Psychiatry since she was 10 with the same provider.  Is unsure if she has been diagnosed with Bipolar 1 or 2, reports will ask her Mother and let [facility] know at her next visit.  Plan: 1. Continue medications as directed by [facility] Psychiatry 2. Keep all scheduled appointments with [facility] Psychiat[facility]      Seasonal allergies    PND contributing to cough when laying flat.  Discussed reduction of smoking and vaping, can help decrease this.  Discussion regarding claritin, zyrtec and allegra, deciding on allegra, to take 1 tablet daily to help clear up seasonal allergy secretions, which should likely lessen her postnasal drip.  Plan: 1. Begin allegra 180mg  daily 2. RTC if symptoms worsen or fail to improve      Relevant Medications   fexofenadine (ALLEGRA ALLERGY) 180 MG tablet      Meds ordered this encounter  Medications  . fexofenadine (ALLEGRA ALLERGY) 180 MG tablet    Sig: Take 1 tablet (180 mg  total) by mouth daily.    Dispense:  30 tablet    Refill:  1   Follow up plan: Return in about 3 months (around 01/10/2021) for CPE with PAP.   , FNP Family Nurse Practitioner [facility] 10/10/2020, 11:44 AM

## 2020-10-10 NOTE — Assessment & Plan Note (Signed)
PHQ9-15/GAD7-9.  Currently on Abilify 400mg  IM monthly injection.  Reports follows with [facility] Psychiatry since she was 10 with the same provider.  Is unsure if she has been diagnosed with Bipolar 1 or 2, reports will ask her Mother and let [facility] know at her next visit.  Plan: 1. Continue medications as directed by [facility] Psychiatry 2. Keep all scheduled appointments with [facility] Psychiat[facility]

## 2020-10-10 NOTE — Assessment & Plan Note (Signed)
PND contributing to cough when laying flat.  Discussed reduction of smoking and vaping, can help decrease this.  Discussion regarding claritin, zyrtec and allegra, deciding on allegra, to take 1 tablet daily to help clear up seasonal allergy secretions, which should likely lessen her postnasal drip.  Plan: 1. Begin allegra 180mg  daily 2. RTC if symptoms worsen or fail to improve

## 2020-10-20 ENCOUNTER — Other Ambulatory Visit: Payer: Self-pay

## 2020-10-20 ENCOUNTER — Encounter: Payer: Self-pay | Admitting: Family Medicine

## 2020-10-20 ENCOUNTER — Telehealth (INDEPENDENT_AMBULATORY_CARE_PROVIDER_SITE_OTHER): Payer: BC Managed Care – PPO | Admitting: Family Medicine

## 2020-10-20 DIAGNOSIS — J019 Acute sinusitis, unspecified: Secondary | ICD-10-CM | POA: Diagnosis not present

## 2020-10-20 MED ORDER — AMOXICILLIN-POT CLAVULANATE 875-125 MG PO TABS: 1.0000 | ORAL_TABLET | Freq: Two times a day (BID) | ORAL | 0 refills | Status: DC

## 2020-10-20 NOTE — Progress Notes (Signed)
Virtual Visit via Telephone  The purpose of this virtual visit is to provide medical care while limiting exposure to the novel coronavirus (COVID19) for both patient and office staff.  Consent was obtained for phone visit:  Yes.   Answered questions that patient had about telehealth interaction:  Yes.   I discussed the limitations, risks, security and privacy concerns of performing an evaluation and management service by telephone. I also discussed with the patient that there may be a patient responsible charge related to this service. The patient expressed understanding and agreed to proceed.  Patient is at home and is accessed via telephone Services are provided by Charlaine Dalton, FNP-C from [facility])  ---------------------------------------------------------------------- Chief Complaint  Patient presents with  . Cough    productive cough with mucus production, scratchy throat, runny nose amd post nasal drainage x 1 week     S: Reviewed CMA documentation. I have called patient and gathered additional HPI as follows:  Jessica Gates presents to clinic for concerns of productive cough, scratchy throat, post nasal drip and runny nose with purulent drainage x 1 week.  Reports facial pain/pain in teeth.  Cough is worse when laying down than sitting up.  Denies change in taste/smell, fever, SOB, DOE, CP, abdominal pain, n/v/d, body aches, chills, shakes.  Reports has been vaccinated for COVID.  Denies exposure to anyone that has been sick or tested positive for COVID.  Denies having COVID testing done.  Patient is currently home Denies any high risk travel to areas of current concern for COVID19. Denies any known or suspected exposure to person with or possibly with COVID19.  Past Medical History:  Diagnosis Date  . ADHD   . Anxiety   . Depression    Social History   Tobacco Use  . Smoking status: Never Smoker  . Smokeless tobacco: Never Used  Vaping Use   . Vaping Use: Some days  . Substances: Nicotine, Flavoring  Substance Use Topics  . Alcohol use: Never  . Drug use: Yes    Types: Marijuana    Current Outpatient Medications:  .  ARIPiprazole ER (ABILIFY MAINTENA) 400 MG SRER injection, Inject 400 mg into the muscle every 28 (twenty-eight) days., Disp: , Rfl:  .  VYVANSE 40 MG capsule, Take 40 mg by mouth at bedtime., Disp: , Rfl:  .  amoxicillin-clavulanate (AUGMENTIN) 875-125 MG tablet, Take 1 tablet by mouth 2 (two) times daily., Disp: 20 tablet, Rfl: 0  Depression screen PHQ 2/9 10/10/2020  Decreased Interest 2  Down, Depressed, Hopeless 3  PHQ - 2 Score 5  Altered sleeping 1  Tired, decreased energy 1  Change in appetite 2  Feeling bad or failure about yourself  3  Trouble concentrating 1  Moving slowly or fidgety/restless 2  Suicidal thoughts 0  PHQ-9 Score 15  Difficult doing work/chores Not difficult at all    GAD 7 : Generalized Anxiety Score 10/10/2020  Nervous, Anxious, on Edge 2  Control/stop worrying 1  Worry too much - different things 2  Trouble relaxing 1  Restless 2  Easily annoyed or irritable 1  Afraid - awful might happen 0  Total GAD 7 Score 9  Anxiety Difficulty Not difficult at all    -------------------------------------------------------------------------- O: No physical exam performed due to remote telephone encounter.  Physical Exam: Patient remotely monitored without video.  Verbal communication appropriate.  Cognition normal.  No results found for this or any previous visit (from the past 2160 hour(s)).  --------------------------------------------------------------------------  A&P:  Problem List Items Addressed This Visit      Respiratory   Acute non-recurrent sinusitis - Primary    Likely bacterial sinus infection based on symptoms.  Will treat with Augmentin.  Encouraged OTC cough medicine such as delsym, nasal spray such as flonase and allergy medicine such as claritin, zyrtec  or allegra to help dry up secretions.  Strict ER precautions provided.  Plan: 1. Begin augmentin 875/125mg  PO BID x 10 days 2. Can use OTC supplements such as delsym for cough, flonase for nasal spray, and allergy medicine to help with secretions such as claritin, zyrtec or allegra. 3. Strict ER precautions provided 4. Encouraged COVID testing 5. RTC PRN      Relevant Medications   amoxicillin-clavulanate (AUGMENTIN) 875-125 MG tablet      Meds ordered this encounter  Medications  . amoxicillin-clavulanate (AUGMENTIN) 875-125 MG tablet    Sig: Take 1 tablet by mouth 2 (two) times daily.    Dispense:  20 tablet    Refill:  0    Follow-up: - Return if symptoms worsen or fail to improve  Patient verbalizes understanding with the above medical recommendations including the limitation of remote medical advice.  Specific follow-up and call-back criteria were given for patient to follow-up or seek medical care more urgently if needed.  - Time spent in direct consultation with patient on phone: 6 minutes  Charlaine Dalton, FNP-C [facility] 10/20/2020, 3:06 PM

## 2020-10-20 NOTE — Patient Instructions (Signed)
I have sent in a prescription for Augmentin 875-163m to take 1 tablet 2x per day for the next 10 days.  Be sure to take this medication with food, as it can be hard on the stomach.  It is important to take the full 10 days of antibiotics  It is encouraged that you have COVID testing done at a local testing center or with a rapid home testing kit.  If you begin to have worsening shortness of breath, chest pain, fever over 104 that is not responsive to ibuprofen and/or acetaminophen, or impending sense [place]  We will plan to see you back if your symptoms worsen or fail to improve  You will receive a survey after today's visit either digitally by e-mail or paper by USPS mail. Your experiences and feedback matter to [place]  Please respond so we know how we are doing as we provide care for you.  Call [facility] any questions/concerns/needs.  It is my goal to be available to you for your health concerns.  Thanks for choosing me to be a partner in your healthcare needs!  NHarlin Rain FNP-C Family Nurse Practitioner [facility] Phone: [phone]

## 2020-10-20 NOTE — Assessment & Plan Note (Signed)
Likely bacterial sinus infection based on symptoms.  Will treat with Augmentin.  Encouraged OTC cough medicine such as delsym, nasal spray such as flonase and allergy medicine such as claritin, zyrtec or allegra to help dry up secretions.  Strict ER precautions provided.  Plan: 1. Begin augmentin 875/125mg  PO BID x 10 days 2. Can use OTC supplements such as delsym for cough, flonase for nasal spray, and allergy medicine to help with secretions such as claritin, zyrtec or allegra. 3. Strict ER precautions provided 4. Encouraged COVID testing 5. RTC PRN

## 2020-10-24 ENCOUNTER — Encounter: Payer: Self-pay | Admitting: *Deleted

## 2020-12-03 ENCOUNTER — Other Ambulatory Visit: Payer: Self-pay | Admitting: Family Medicine

## 2020-12-03 DIAGNOSIS — J302 Other seasonal allergic rhinitis: Secondary | ICD-10-CM

## 2021-01-26 ENCOUNTER — Ambulatory Visit: Payer: Self-pay

## 2021-01-26 NOTE — Telephone Encounter (Signed)
Patient called stating that she has chest pressure and trouble drawling a deep breath.  She states that this started about 1 week ago. She states that her period went 20 days late and started on the 13th .. she states that she passed clots and dark blood. She denies fever. She states that she has had abdominal pain too.   She has experienced dizziness. Per protocol patient will go to ER for chest pressure and dizziness..  Reason for Disposition . Difficulty breathing  Answer Assessment - Initial Assessment Questions 1. LOCATION: "Where does it hurt?"       Center chest 2. RADIATION: "Does the pain go anywhere else?" (e.g., into neck, jaw, arms, back)     no 3. ONSET: "When did the chest pain begin?" (Minutes, hours or days)      1 week ago 4. PATTERN "Does the pain come and go, or has it been constant since it started?"  "Does it get worse with exertion?"      constant 5. DURATION: "How long does it last" (e.g., seconds, minutes, hours)     constant 6. SEVERITY: "How bad is the pain?"  (e.g., Scale 1-10; mild, moderate, or severe)    - MILD (1-3): doesn't interfere with normal activities     - MODERATE (4-7): interferes with normal activities or awakens from sleep    - SEVERE (8-10): excruciating pain, unable to do any normal activities       Just lays in bed 7. CARDIAC RISK FACTORS: "Do you have any history of heart problems or risk factors for heart disease?" (e.g., angina, prior heart attack; diabetes, high blood pressure, high cholesterol, smoker, or strong family history of heart disease)     vaps 8. PULMONARY RISK FACTORS: "Do you have any history of lung disease?"  (e.g., blood clots in lung, asthma, emphysema, birth control pills)    none 9. CAUSE: "What do you think is causing the chest pain?"     usure  Had period 20 days late 10. OTHER SYMPTOMS: "Do you have any other symptoms?" (e.g., dizziness, nausea, vomiting, sweating, fever, difficulty breathing, cough)      Dark red with  clot period, sometimes feels that she does not get full breath of air 11. PREGNANCY: "Is there any chance you are pregnant?" "When was your last menstrual period?"       13-17 january  Protocols used: CHEST PAIN-A-AH

## 2021-03-28 ENCOUNTER — Ambulatory Visit: Payer: BLUE CROSS/BLUE SHIELD | Admitting: Unknown Physician Specialty

## 2021-03-30 ENCOUNTER — Other Ambulatory Visit: Payer: Self-pay

## 2021-03-30 ENCOUNTER — Encounter: Payer: Self-pay | Admitting: Physician Assistant

## 2021-03-30 ENCOUNTER — Ambulatory Visit (INDEPENDENT_AMBULATORY_CARE_PROVIDER_SITE_OTHER): Payer: BC Managed Care – PPO | Admitting: Physician Assistant

## 2021-03-30 VITALS — BP 99/66 | HR 87 | Temp 97.5°F | Ht 66.0 in | Wt 164.2 lb

## 2021-03-30 DIAGNOSIS — R109 Unspecified abdominal pain: Secondary | ICD-10-CM

## 2021-03-30 MED ORDER — ONDANSETRON HCL 4 MG PO TABS: 4.0000 mg | ORAL_TABLET | Freq: Three times a day (TID) | ORAL | 0 refills | Status: DC | PRN

## 2021-03-30 NOTE — Patient Instructions (Signed)
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
## 2021-05-23 ENCOUNTER — Ambulatory Visit: Payer: BC Managed Care – PPO | Admitting: Internal Medicine

## 2021-05-23 NOTE — Progress Notes (Deleted)
Subjective:    Patient ID: Jessica Gates, female    DOB: 10-11-1995, 26 y.o.   MRN: [id number]  HPI  Patient presents the clinic today with complaint of diarrhea.  Review of Systems      Past Medical History:  Diagnosis Date  . ADHD   . ADHD (attention deficit hyperactivity disorder)   . Anxiety   . Bipolar disorder (HCC)   . Depression   . GERD (gastroesophageal reflux disease)   . Headache    MIGRAINES  . Hyperlipidemia   . OCD (obsessive compulsive disorder)   . Personality disorder [facility])     Current Outpatient Medications  Medication Sig Dispense Refill  . ALPRAZolam (XANAX) 0.5 MG tablet Take 0.5 mg by mouth daily as needed for anxiety. (Patient not taking: Reported on 03/30/2021)    . amphetamine-dextroamphetamine (ADDERALL XR) 15 MG 24 hr capsule Take 15 mg by mouth every morning.    . ARIPiprazole ER (ABILIFY MAINTENA) 400 MG SRER injection Inject 400 mg into the muscle every 28 (twenty-eight) days.    Marland Kitchen azithromycin (ZITHROMAX) 250 MG tablet Take 1 a day for 4 days (Patient not taking: Reported on 03/30/2021) 4 each 0  . ondansetron (ZOFRAN) 4 MG tablet Take 1 tablet (4 mg total) by mouth every 8 (eight) hours as needed for nausea or vomiting. 20 tablet 0  . oxyCODONE-acetaminophen (ROXICET) 5-325 MG tablet Take 1 tablet by mouth every 6 (six) hours as needed for severe pain. (Patient not taking: No sig reported) 6 tablet 0   No current facility-administered medications for this visit.    No Known Allergies  Family History  Problem Relation Age of Onset  . Cholelithiasis Mother   . Cancer Mother 30       kidney  . Mental illness Mother   . Appendicitis Father   . Cancer Maternal Grandfather        lung and throat  . Diabetes Paternal Grandmother     Social History   Socioeconomic History  . Marital status: Single    Spouse name: Not on file  . Number of children: Not on file  . Years of education: Not on file  . Highest education level: Not on  file  Occupational History  . Not on file  Tobacco Use  . Smoking status: Never Smoker  . Smokeless tobacco: Never Used  Vaping Use  . Vaping Use: Some days  . Substances: Nicotine, Flavoring  Substance and Sexual Activity  . Alcohol use: Never    Comment: occ  . Drug use: Yes    Types: Marijuana    Comment: percocet  . Sexual activity: Yes  Other Topics Concern  . Not on file  Social History Narrative   ** Merged History Encounter **       Social Determinants of Health   Financial Resource Strain: Not on file  Food Insecurity: Not on file  Transportation Needs: Not on file  Physical Activity: Not on file  Stress: Not on file  Social Connections: Not on file  Intimate Partner Violence: Not on file     Constitutional: Denies fever, malaise, fatigue, headache or abrupt weight changes.  HEENT: Denies eye pain, eye redness, ear pain, ringing in the ears, wax buildup, runny nose, nasal congestion, bloody nose, or sore throat. Respiratory: Denies difficulty breathing, shortness of breath, cough or sputum production.   Cardiovascular: Denies chest pain, chest tightness, palpitations or swelling in the hands or feet.  Gastrointestinal: Denies abdominal pain,  bloating, constipation, diarrhea or blood in the stool.  GU: Denies urgency, frequency, pain with urination, burning sensation, blood in urine, odor or discharge. Musculoskeletal: Denies decrease in range of motion, difficulty with gait, muscle pain or joint pain and swelling.  Skin: Denies redness, rashes, lesions or ulcercations.  Neurological: Denies dizziness, difficulty with memory, difficulty with speech or problems with balance and coordination.  Psych: Denies anxiety, depression, SI/HI.  No other specific complaints in a complete review of systems (except as listed in HPI above).  Objective:   Physical Exam   There were no vitals taken for this visit. Wt Readings from Last 3 Encounters:  03/30/21 164 lb 3.2 oz  (74.5 kg)  10/10/20 153 lb 12.8 oz (69.8 kg)  05/31/20 140 lb (63.5 kg)    General: Appears their stated age, well developed, well nourished in NAD. Skin: Warm, dry and intact. No rashes, lesions or ulcerations noted. HEENT: Head: normal shape and size; Eyes: sclera white, no icterus, conjunctiva pink, PERRLA and EOMs intact; Ears: Tm's gray and intact, normal light reflex; Nose: mucosa pink and moist, septum midline; Throat/Mouth: Teeth present, mucosa pink and moist, no exudate, lesions or ulcerations noted.  Neck:  Neck supple, trachea midline. No masses, lumps or thyromegaly present.  Cardiovascular: Normal rate and rhythm. S1,S2 noted.  No murmur, rubs or gallops noted. No JVD or BLE edema. No carotid bruits noted. Pulmonary/Chest: Normal effort and positive vesicular breath sounds. No respiratory distress. No wheezes, rales or ronchi noted.  Abdomen: Soft and nontender. Normal bowel sounds. No distention or masses noted. Liver, spleen and kidneys non palpable. Musculoskeletal: Normal range of motion. No signs of joint swelling. No difficulty with gait.  Neurological: Alert and oriented. Cranial nerves II-XII grossly intact. Coordination normal.  Psychiatric: Mood and affect normal. Behavior is normal. Judgment and thought content normal.   EKG:  BMET    Component Value Date/Time   NA 136 09/29/2019 1427   NA 137 12/24/2013 1138   K 4.3 09/29/2019 1427   K 4.1 12/24/2013 1138   CL 102 09/29/2019 1427   CL 105 12/24/2013 1138   CO2 27 09/29/2019 1427   CO2 28 (H) 12/24/2013 1138   GLUCOSE 91 09/29/2019 1427   GLUCOSE 83 12/24/2013 1138   BUN 15 09/29/2019 1427   BUN 12 12/24/2013 1138   CREATININE 0.76 09/29/2019 1427   CREATININE 0.88 12/24/2013 1138   CALCIUM 9.6 09/29/2019 1427   CALCIUM 9.1 12/24/2013 1138   GFRNONAA >60 09/29/2019 1427   GFRAA >60 09/29/2019 1427    Lipid Panel  No results found for: CHOL, TRIG, HDL, CHOLHDL, VLDL, LDLCALC  CBC    Component  Value Date/Time   WBC 5.0 09/29/2019 1427   RBC 4.47 09/29/2019 1427   HGB 14.4 09/29/2019 1427   HGB 13.4 12/24/2013 1138   HCT 42.2 09/29/2019 1427   HCT 39.2 12/24/2013 1138   PLT 296 09/29/2019 1427   PLT 243 12/24/2013 1138   MCV 94.4 09/29/2019 1427   MCV 87 12/24/2013 1138   MCH 32.2 09/29/2019 1427   MCHC 34.1 09/29/2019 1427   RDW 12.3 09/29/2019 1427   RDW 14.1 12/24/2013 1138   LYMPHSABS 1.8 07/24/2016 1155   MONOABS 0.3 07/24/2016 1155   EOSABS 0.2 07/24/2016 1155   BASOSABS 0.0 07/24/2016 1155    Hgb A1C No results found for: HGBA1C         Assessment & Plan:     Nicki Reaper, NP This visit occurred  during the SARS-CoV-2 public health emergency.  Safety protocols were in place, including screening questions prior to the visit, additional usage of staff PPE, and extensive cleaning of exam room while observing appropriate contact time as indicated for disinfecting solutions.

## 2021-06-30 ENCOUNTER — Ambulatory Visit: Payer: Self-pay | Admitting: Internal Medicine

## 2021-06-30 NOTE — Progress Notes (Deleted)
Subjective:    Patient ID: Jessica Gates, female    DOB: 1995-09-10, 26 y.o.   MRN: [id number]  HPI  Pt presents to the clinic today with c/o a missed menses. Her LMP was. She is currently taking Abilify for depression and Adderall for ADHD.  Review of Systems     Past Medical History:  Diagnosis Date   ADHD    ADHD (attention deficit hyperactivity disorder)    Anxiety    Bipolar disorder (HCC)    Depression    GERD (gastroesophageal reflux disease)    Headache    MIGRAINES   Hyperlipidemia    OCD (obsessive compulsive disorder)    Personality disorder (HCC)     Current Outpatient Medications  Medication Sig Dispense Refill   amphetamine-dextroamphetamine (ADDERALL XR) 15 MG 24 hr capsule Take 15 mg by mouth every morning.     ARIPiprazole ER (ABILIFY MAINTENA) 400 MG SRER injection Inject 400 mg into the muscle every 28 (twenty-eight) days.     ondansetron (ZOFRAN) 4 MG tablet Take 1 tablet (4 mg total) by mouth every 8 (eight) hours as needed for nausea or vomiting. 20 tablet 0   No current facility-administered medications for this visit.    No Known Allergies  Family History  Problem Relation Age of Onset   Cholelithiasis Mother    Cancer Mother 66       kidney   Mental illness Mother    Appendicitis Father    Cancer Maternal Grandfather        lung and throat   Diabetes Paternal Grandmother     Social History   Socioeconomic History   Marital status: Single    Spouse name: Not on file   Number of children: Not on file   Years of education: Not on file   Highest education level: Not on file  Occupational History   Not on file  Tobacco Use   Smoking status: Never   Smokeless tobacco: Never  Vaping Use   Vaping Use: Some days   Substances: Nicotine, Flavoring  Substance and Sexual Activity   Alcohol use: Never    Comment: occ   Drug use: Yes    Types: Marijuana    Comment: percocet   Sexual activity: Yes  Other Topics Concern   Not on  file  Social History Narrative   ** Merged History Encounter **       Social Determinants of Health   Financial Resource Strain: Not on file  Food Insecurity: Not on file  Transportation Needs: Not on file  Physical Activity: Not on file  Stress: Not on file  Social Connections: Not on file  Intimate Partner Violence: Not on file     Constitutional: Denies fever, malaise, fatigue, headache or abrupt weight changes.  HEENT: Denies eye pain, eye redness, ear pain, ringing in the ears, wax buildup, runny nose, nasal congestion, bloody nose, or sore throat. Respiratory: Denies difficulty breathing, shortness of breath, cough or sputum production.   Cardiovascular: Denies chest pain, chest tightness, palpitations or swelling in the hands or feet.  Gastrointestinal: Denies abdominal pain, bloating, constipation, diarrhea or blood in the stool.  GU: Pt reports missed menses. Denies urgency, frequency, pain with urination, burning sensation, blood in urine, odor or discharge. Musculoskeletal: Denies decrease in range of motion, difficulty with gait, muscle pain or joint pain and swelling.  Skin: Denies redness, rashes, lesions or ulcercations.  Neurological: Denies dizziness, difficulty with memory, difficulty with speech or problems  with balance and coordination.  Psych: Denies anxiety, depression, SI/HI.  No other specific complaints in a complete review of systems (except as listed in HPI above).  Objective:   Physical Exam   There were no vitals taken for this visit. Wt Readings from Last 3 Encounters:  03/30/21 164 lb 3.2 oz (74.5 kg)  10/10/20 153 lb 12.8 oz (69.8 kg)  05/31/20 140 lb (63.5 kg)    General: Appears their stated age, well developed, well nourished in NAD. Skin: Warm, dry and intact. No rashes, lesions or ulcerations noted. HEENT: Head: normal shape and size; Eyes: sclera white, no icterus, conjunctiva pink, PERRLA and EOMs intact; Ears: Tm's gray and intact,  normal light reflex; Nose: mucosa pink and moist, septum midline; Throat/Mouth: Teeth present, mucosa pink and moist, no exudate, lesions or ulcerations noted.  Neck:  Neck supple, trachea midline. No masses, lumps or thyromegaly present.  Cardiovascular: Normal rate and rhythm. S1,S2 noted.  No murmur, rubs or gallops noted. No JVD or BLE edema. No carotid bruits noted. Pulmonary/Chest: Normal effort and positive vesicular breath sounds. No respiratory distress. No wheezes, rales or ronchi noted.  Abdomen: Soft and nontender. Normal bowel sounds. No distention or masses noted. Liver, spleen and kidneys non palpable. Musculoskeletal: Normal range of motion. No signs of joint swelling. No difficulty with gait.  Neurological: Alert and oriented. Cranial nerves II-XII grossly intact. Coordination normal.  Psychiatric: Mood and affect normal. Behavior is normal. Judgment and thought content normal.     BMET    Component Value Date/Time   NA 136 09/29/2019 1427   NA 137 12/24/2013 1138   K 4.3 09/29/2019 1427   K 4.1 12/24/2013 1138   CL 102 09/29/2019 1427   CL 105 12/24/2013 1138   CO2 27 09/29/2019 1427   CO2 28 (H) 12/24/2013 1138   GLUCOSE 91 09/29/2019 1427   GLUCOSE 83 12/24/2013 1138   BUN 15 09/29/2019 1427   BUN 12 12/24/2013 1138   CREATININE 0.76 09/29/2019 1427   CREATININE 0.88 12/24/2013 1138   CALCIUM 9.6 09/29/2019 1427   CALCIUM 9.1 12/24/2013 1138   GFRNONAA >60 09/29/2019 1427   GFRAA >60 09/29/2019 1427    Lipid Panel  No results found for: CHOL, TRIG, HDL, CHOLHDL, VLDL, LDLCALC  CBC    Component Value Date/Time   WBC 5.0 09/29/2019 1427   RBC 4.47 09/29/2019 1427   HGB 14.4 09/29/2019 1427   HGB 13.4 12/24/2013 1138   HCT 42.2 09/29/2019 1427   HCT 39.2 12/24/2013 1138   PLT 296 09/29/2019 1427   PLT 243 12/24/2013 1138   MCV 94.4 09/29/2019 1427   MCV 87 12/24/2013 1138   MCH 32.2 09/29/2019 1427   MCHC 34.1 09/29/2019 1427   RDW 12.3 09/29/2019  1427   RDW 14.1 12/24/2013 1138   LYMPHSABS 1.8 07/24/2016 1155   MONOABS 0.3 07/24/2016 1155   EOSABS 0.2 07/24/2016 1155   BASOSABS 0.0 07/24/2016 1155    Hgb A1C No results found for: HGBA1C         Assessment & Plan:     Nicki Reaper, NP This visit occurred during the SARS-CoV-2 public health emergency.  Safety protocols were in place, including screening questions prior to the visit, additional usage of staff PPE, and extensive cleaning of exam room while observing appropriate contact time as indicated for disinfecting solutions.

## 2021-12-28 ENCOUNTER — Ambulatory Visit: Payer: BC Managed Care – PPO | Admitting: Internal Medicine

## 2022-02-01 ENCOUNTER — Ambulatory Visit (INDEPENDENT_AMBULATORY_CARE_PROVIDER_SITE_OTHER): Payer: BC Managed Care – PPO | Admitting: Internal Medicine

## 2022-02-01 ENCOUNTER — Encounter: Payer: Self-pay | Admitting: Internal Medicine

## 2022-02-01 VITALS — BP 120/64 | HR 106 | Temp 98.0°F | Resp 16 | Ht 66.0 in | Wt 161.8 lb

## 2022-02-01 DIAGNOSIS — F909 Attention-deficit hyperactivity disorder, unspecified type: Secondary | ICD-10-CM | POA: Diagnosis not present

## 2022-02-01 DIAGNOSIS — F319 Bipolar disorder, unspecified: Secondary | ICD-10-CM | POA: Diagnosis not present

## 2022-02-01 DIAGNOSIS — R1013 Epigastric pain: Secondary | ICD-10-CM

## 2022-02-01 DIAGNOSIS — Z842 Family history of other diseases of the genitourinary system: Secondary | ICD-10-CM | POA: Diagnosis not present

## 2022-02-01 DIAGNOSIS — Z1159 Encounter for screening for other viral diseases: Secondary | ICD-10-CM

## 2022-02-01 DIAGNOSIS — Z114 Encounter for screening for human immunodeficiency virus [HIV]: Secondary | ICD-10-CM

## 2022-02-01 MED ORDER — PANTOPRAZOLE SODIUM 40 MG PO TBEC: 40.0000 mg | DELAYED_RELEASE_TABLET | Freq: Every day | ORAL | 3 refills | Status: DC

## 2022-02-01 NOTE — Assessment & Plan Note (Signed)
Following with Psychiatry, doing well on Lithium, Abilify.

## 2022-02-01 NOTE — Progress Notes (Signed)
New Patient Office Visit  Subjective:  Patient ID: Jessica Gates, female    DOB: 03/16/1995  Age: 27 y.o. MRN: [id number]  CC:  Chief Complaint  Patient presents with   Establish Care    Pt states if medication can be given to her for stomach issues. Pt deals with nausea everyday due to her stomach issues, has already been referred to Gastroenterologist but appointment is set until July,2023.    HPI [name] presents to establish care. Chronic medical conditions include Bipolar I disorder, ADHD.   Bipolar I/ADHD: -Currently on Abilify 10 mg, Adderall 10 BID and Lithium carbonate 300 -Compliant with medications and denies any side effects -Seeing Psychiatry since 27 years old, has blood work done every few months to check lithium levels.  Abdominal Complaint: Ever since cholecystectomy in 2017 she's had epigastric/bilateral lower quadrant pain every day. Associated with nausea, daily vomiting, diarrhea. Does see a GI in [facility] that diagnosed her with IBS-D but had concerns about possible endometriosis. She has not noticed any association with her menstrual cycles. She does have regular periods, her LMP being 01/17/22. Does have a family history of endometriosis in her mother.   -Duration:  2017 after cholecystectomy  -Frequency: constant -Nature: burning -Location: epigastric, LLQ, and RLQ  -Severity: 10/10 worse in the morning and night  -Radiation: no -Alleviating factors: marijuana -Aggravating factors: movement and eating, not sure about how periods effects the pain -Treatments attempted: Zofran but doesn't help -Constipation: no -Diarrhea: yes -Mucous in the stool: no -Heartburn: no -Bloating:no -Nausea: yes -Vomiting: yes -Episodes of vomit/day: multiple, worse in morning  -Melena or hematochezia: no -Weight loss: yes in 2017 after the surgery but not since  -Change in Appetite: no  Health Maintenance: -Blood work due -Pap due -HPV up to date  Past  Medical History:  Diagnosis Date   ADHD    ADHD (attention deficit hyperactivity disorder)    Anxiety    Bipolar disorder (HCC)    Depression    GERD (gastroesophageal reflux disease)    Headache    MIGRAINES   Hyperlipidemia    OCD (obsessive compulsive disorder)    Personality disorder (HCC)     Past Surgical History:  Procedure Laterality Date   CHOLECYSTECTOMY N/A 02/22/2016   Procedure: LAPAROSCOPIC CHOLECYSTECTOMY WITH INTRAOPERATIVE CHOLANGIOGRAM;  Surgeon: Lattie Haw, MD;  Location: [facility] ORS;  Service: General;  Laterality: N/A;   CHOLECYSTECTOMY  02/2016    Family History  Problem Relation Age of Onset   Cholelithiasis Mother    Cancer Mother 48       kidney   Mental illness Mother    Appendicitis Father    Cancer Maternal Grandfather        lung and throat   Diabetes Paternal Grandmother     Social History   Socioeconomic History   Marital status: Single    Spouse name: Not on file   Number of children: Not on file   Years of education: Not on file   Highest education level: Not on file  Occupational History   Not on file  Tobacco Use   Smoking status: Never   Smokeless tobacco: Never  Vaping Use   Vaping Use: Some days   Substances: Nicotine, Flavoring  Substance and Sexual Activity   Alcohol use: Never    Comment: occ   Drug use: Yes    Types: Marijuana    Comment: percocet   Sexual activity: Yes  Other Topics Concern  Not on file  Social History Narrative   ** Merged History Encounter **       Social Determinants of Health   Financial Resource Strain: Not on file  Food Insecurity: Not on file  Transportation Needs: Not on file  Physical Activity: Not on file  Stress: Not on file  Social Connections: Not on file  Intimate Partner Violence: Not on file    ROS Review of Systems  Constitutional:  Negative for chills, fever and unexpected weight change.  Respiratory:  Negative for cough and shortness of breath.    Cardiovascular:  Negative for chest pain.  Gastrointestinal:  Positive for abdominal pain, diarrhea, nausea and vomiting. Negative for abdominal distention, blood in stool and constipation.  Genitourinary:  Negative for dysuria, hematuria and menstrual problem.  Neurological:  Negative for dizziness and headaches.   Objective:   Today's Vitals: BP 120/64    Pulse (!) 106    Temp 98 F (36.7 C) (Oral)    Resp 16    Ht 5\' 6"  (1.676 m)    Wt 161 lb 12.8 oz (73.4 kg)    LMP 01/17/2021 (Exact Date)    SpO2 100%    BMI 26.12 kg/m   Physical Exam Constitutional:      Appearance: Normal appearance.  HENT:     Head: Normocephalic and atraumatic.  Eyes:     Conjunctiva/sclera: Conjunctivae normal.  Cardiovascular:     Rate and Rhythm: Normal rate and regular rhythm.  Pulmonary:     Effort: Pulmonary effort is normal.     Breath sounds: Normal breath sounds.  Abdominal:     General: Bowel sounds are normal. There is no distension.     Palpations: Abdomen is soft.     Tenderness: There is abdominal tenderness. There is no right CVA tenderness, left CVA tenderness, guarding or rebound.     Comments: Epigastric and RLQ tenderness on exam, no peritoneal signs present  Musculoskeletal:     Right lower leg: No edema.     Left lower leg: No edema.  Skin:    General: Skin is warm and dry.  Neurological:     General: No focal deficit present.     Mental Status: She is alert. Mental status is at baseline.  Psychiatric:        Mood and Affect: Mood normal.        Behavior: Behavior normal.    Assessment & Plan:   1. Epigastric pain/Family history of endometriosis: GI diagnosed her with IBS-D, although she is still complaining of constant epigastric pain, nausea/vomiting daily. She does smoke marijuana to help with her symptoms, consider cycling vomiting syndrome. There were concerns about endometriosis, referral to gynecology placed. Will try PPI trial and have her follow up in 3 months and  obtain CBC, CMP.   - CBC w/Diff/Platelet - COMPLETE METABOLIC PANEL WITH GFR - Ambulatory referral to Gynecology  2. Bipolar I disorder ([place]): Following with Psychiatry, doing well on Lithium, Abilify.   3. Attention deficit hyperactivity disorder (ADHD), unspecified ADHD type: Stable, currently on Adderall and seeing Psychiatry.   4. Encounter for screening for HIV/Need for hepatitis C screening test: Screening for HIV/Hepatitis C today as well.   - HIV antibody (with reflex) - Hepatitis C Antibody   Follow-up: Return in about 3 months (around 05/01/2022).   Teodora Medici, DO

## 2022-02-01 NOTE — Patient Instructions (Addendum)
It was great seeing you today!  Plan discussed at today's visit: -Blood work ordered today, results will be uploaded to MyChart.  -Take Protonix once daily for acid suppression  -Referral to Gynecology ordered today, we will call you to set this up  Follow up in: 3 months   Take care and let us know if you have any questions or concerns prior to your next visit.  Dr. Caralee Ates

## 2022-02-01 NOTE — Assessment & Plan Note (Signed)
Stable, currently on Adderall and seeing Psychiatry.

## 2022-02-02 LAB — CBC WITH DIFFERENTIAL/PLATELET
Absolute Monocytes: 424 cells/uL (ref 200–950)
Basophils Absolute: 54 cells/uL (ref 0–200)
Basophils Relative: 0.7 %
Eosinophils Absolute: 270 cells/uL (ref 15–500)
Eosinophils Relative: 3.5 %
HCT: 44.3 % (ref 35.0–45.0)
Hemoglobin: 15 g/dL (ref 11.7–15.5)
Lymphs Abs: 1301 cells/uL (ref 850–3900)
MCH: 32 pg (ref 27.0–33.0)
MCHC: 33.9 g/dL (ref 32.0–36.0)
MCV: 94.5 fL (ref 80.0–100.0)
MPV: 11 fL (ref 7.5–12.5)
Monocytes Relative: 5.5 %
Neutro Abs: 5652 cells/uL (ref 1500–7800)
Neutrophils Relative %: 73.4 %
Platelets: 323 10*3/uL (ref 140–400)
RBC: 4.69 10*6/uL (ref 3.80–5.10)
RDW: 12 % (ref 11.0–15.0)
Total Lymphocyte: 16.9 %
WBC: 7.7 10*3/uL (ref 3.8–10.8)

## 2022-02-02 LAB — COMPLETE METABOLIC PANEL WITH GFR
AG Ratio: 1.8 (calc) (ref 1.0–2.5)
ALT: 12 U/L (ref 6–29)
AST: 12 U/L (ref 10–30)
Albumin: 4.6 g/dL (ref 3.6–5.1)
Alkaline phosphatase (APISO): 90 U/L (ref 31–125)
BUN: 11 mg/dL (ref 7–25)
CO2: 29 mmol/L (ref 20–32)
Calcium: 10.1 mg/dL (ref 8.6–10.2)
Chloride: 103 mmol/L (ref 98–110)
Creat: 0.77 mg/dL (ref 0.50–0.96)
Globulin: 2.6 g/dL (calc) (ref 1.9–3.7)
Glucose, Bld: 90 mg/dL (ref 65–99)
Potassium: 4.7 mmol/L (ref 3.5–5.3)
Sodium: 139 mmol/L (ref 135–146)
Total Bilirubin: 0.7 mg/dL (ref 0.2–1.2)
Total Protein: 7.2 g/dL (ref 6.1–8.1)
eGFR: 110 mL/min/{1.73_m2} (ref >=60)

## 2022-02-02 LAB — HEPATITIS C ANTIBODY
Hepatitis C Ab: NONREACTIVE
SIGNAL TO CUT-OFF: <0.02 (ref <1.00)

## 2022-02-02 LAB — HIV ANTIBODY (ROUTINE TESTING W REFLEX): HIV 1&2 Ab, 4th Generation: NONREACTIVE

## 2022-02-05 ENCOUNTER — Telehealth: Payer: Self-pay

## 2022-02-05 NOTE — Telephone Encounter (Signed)
[facility] referring for Epigastric pain, Family history of endometriosis. MD only. Called and left voicemail for patient to call back to be scheduled.

## 2022-02-05 NOTE — Telephone Encounter (Signed)
Patient is scheduled for 03/09/22 with [facility]

## 2022-02-14 ENCOUNTER — Ambulatory Visit: Payer: Self-pay | Admitting: *Deleted

## 2022-02-14 DIAGNOSIS — R109 Unspecified abdominal pain: Secondary | ICD-10-CM

## 2022-02-14 DIAGNOSIS — R112 Nausea with vomiting, unspecified: Secondary | ICD-10-CM

## 2022-02-14 NOTE — Telephone Encounter (Signed)
Summary: nausea, med isnt helping   Patient called in states still having nausea, the medicine given isnt helping.  She is asking for another alternative.      Reason for Disposition  Prescription request for new medicine (not a refill)  Answer Assessment - Initial Assessment Questions 1. NAME of MEDICATION: "What medicine are you calling about?"     Pantoprazole- not helping symptoms 2. QUESTION: "What is your question?" (e.g., double dose of medicine, side effect)     Patient is requesting an alternative medication- ondansetron did not help either 3. PRESCRIBING HCP: "Who prescribed it?" Reason: if prescribed by specialist, call should be referred to that group.     PCP 4. SYMPTOMS: "Do you have any symptoms?"     Nausea all day- with eating/drinking- makes it difficult for patient to consime food 5. SEVERITY: If symptoms are present, ask "Are they mild, moderate or severe?"     severe 6. PREGNANCY:  "Is there any chance that you are pregnant?" "When was your last menstrual period?"     Not pregnant  Protocols used: Medication Question Call-A-AH

## 2022-02-14 NOTE — Telephone Encounter (Signed)
°  Chief Complaint: alternative medication request Symptoms: nausea/vomiting Frequency: daily nausea Pertinent Negatives:  Disposition: [] ED /[] Urgent Care (no appt availability in office) / [] Appointment(In office/virtual)/ []  [facility]/ [] Home Care/ [] Refused Recommended Disposition /[] [facility]/ [x]  Follow-up with PCP Additional Notes: Patient states the last medication prescribed is not helping - requesting other options

## 2022-02-15 MED ORDER — ONDANSETRON HCL 4 MG PO TABS: 4.0000 mg | ORAL_TABLET | Freq: Three times a day (TID) | ORAL | 1 refills | Status: DC | PRN

## 2022-02-15 NOTE — Telephone Encounter (Signed)
Needs to be evaluated by GI - has been seeing GI at [facility] is she still seeing them? She can discontinue the Pantoprazole if it's not controlling her symptoms, will refill Zofran to use as needed. If symptoms are severe and she is unable to keep any food down, recommend going to the emergency room for evaluation.

## 2022-02-15 NOTE — Addendum Note (Signed)
Addended by: Margarita Mail on: 02/15/2022 11:09 AM   Modules accepted: Orders

## 2022-03-02 ENCOUNTER — Ambulatory Visit: Payer: BC Managed Care – PPO | Admitting: Internal Medicine

## 2022-03-02 NOTE — Progress Notes (Deleted)
? ?Established Patient Office Visit ? ?Subjective:  ?Patient ID: Jessica Gates, female    DOB: 22-Jul-1995  Age: 27 y.o. MRN: [id number] ? ?CC: No chief complaint on file. ? ? ?HPI ?Jessica Gates presents for follow up on abdominal pain.  ? ?Abdominal Complaint: Ever since cholecystectomy in 2017 she's had epigastric/bilateral lower quadrant pain every day. Associated with nausea, daily vomiting, diarrhea. Does see a GI in [facility] that diagnosed her with IBS-D but had concerns about possible endometriosis. She has not noticed any association with her menstrual cycles. She does have regular periods, her LMP being 01/17/22. Does have a family history of endometriosis in her mother. Was started on a PPI, which did not help. Does smoke marijuana every day.  ?  ?-Duration:  2017 after cholecystectomy  ?-Frequency: constant ?-Nature: burning ?-Location: epigastric, LLQ, and RLQ  ?-Severity: 10/10 worse in the morning and night  ?-Radiation: no ?-Alleviating factors: marijuana ?-Aggravating factors: movement and eating, not sure about how periods effects the pain ?-Treatments attempted: Zofran but doesn't help ?-Constipation: no ?-Diarrhea: yes ?-Mucous in the stool: no ?-Heartburn: no ?-Bloating:no ?-Nausea: yes ?-Vomiting: yes ?-Episodes of vomit/day: multiple, worse in morning  ?-Melena or hematochezia: no ?-Weight loss: yes in 2017 after the surgery but not since  ?-Change in Appetite: no ? ?Past Medical History:  ?Diagnosis Date  ? ADHD   ? ADHD (attention deficit hyperactivity disorder)   ? Anxiety   ? Bipolar disorder ([place])   ? Depression   ? GERD (gastroesophageal reflux disease)   ? Headache   ? MIGRAINES  ? Hyperlipidemia   ? OCD (obsessive compulsive disorder)   ? Personality disorder ([place])   ? ? ?Past Surgical History:  ?Procedure Laterality Date  ? CHOLECYSTECTOMY N/A 02/22/2016  ? Procedure: LAPAROSCOPIC CHOLECYSTECTOMY WITH INTRAOPERATIVE CHOLANGIOGRAM;  Surgeon: Jessica Glen, MD;  Location: [facility] ORS;   Service: General;  Laterality: N/A;  ? CHOLECYSTECTOMY  02/2016  ? ? ?Family History  ?Problem Relation Age of Onset  ? Cholelithiasis Mother   ? Cancer Mother 37  ?     kidney  ? Mental illness Mother   ? Appendicitis Father   ? Cancer Maternal Grandfather   ?     lung and throat  ? Diabetes Paternal Grandmother   ? ? ?Social History  ? ?Socioeconomic History  ? Marital status: Single  ?  Spouse name: Not on file  ? Number of children: Not on file  ? Years of education: Not on file  ? Highest education level: Not on file  ?Occupational History  ? Not on file  ?Tobacco Use  ? Smoking status: Never  ? Smokeless tobacco: Never  ?Vaping Use  ? Vaping Use: Some days  ? Substances: Nicotine, Flavoring  ?Substance and Sexual Activity  ? Alcohol use: Never  ?  Comment: occ  ? Drug use: Yes  ?  Types: Marijuana  ?  Comment: percocet  ? Sexual activity: Yes  ?Other Topics Concern  ? Not on file  ?Social History Narrative  ? ** Merged History Encounter **  ?    ? ?Social Determinants of Health  ? ?Financial Resource Strain: Not on file  ?Food Insecurity: Not on file  ?Transportation Needs: Not on file  ?Physical Activity: Not on file  ?Stress: Not on file  ?Social Connections: Not on file  ?Intimate Partner Violence: Not on file  ? ? ?Outpatient Medications Prior to Visit  ?Medication Sig Dispense Refill  ? amphetamine-dextroamphetamine (ADDERALL)  10 MG tablet Take 10 mg by mouth 2 (two) times daily.    ? ARIPiprazole (ABILIFY) 10 MG tablet Take 10 mg by mouth daily.    ? ARIPiprazole ER (ABILIFY MAINTENA) 400 MG SRER injection Inject 400 mg into the muscle every 28 (twenty-eight) days.    ? lithium carbonate 300 MG capsule Take 300 mg by mouth 2 (two) times daily.    ? ondansetron (ZOFRAN) 4 MG tablet Take 1 tablet (4 mg total) by mouth every 8 (eight) hours as needed for nausea, vomiting or refractory nausea / vomiting. 30 tablet 1  ? pantoprazole (PROTONIX) 40 MG tablet Take 1 tablet (40 mg total) by mouth daily. 30 tablet  3  ? ?No facility-administered medications prior to visit.  ? ? ?No Known Allergies ? ?ROS ?Review of Systems ? ?  ?Objective:  ?  ?Physical Exam ? ?There were no vitals taken for this visit. ?Wt Readings from Last 3 Encounters:  ?02/01/22 161 lb 12.8 oz (73.4 kg)  ?03/30/21 164 lb 3.2 oz (74.5 kg)  ?10/10/20 153 lb 12.8 oz (69.8 kg)  ? ? ? ?Health Maintenance Due  ?Topic Date Due  ? TETANUS/TDAP  06/09/2017  ? PAP-Cervical Cytology Screening  Never done  ? PAP SMEAR-Modifier  Never done  ? COVID-19 Vaccine (3 - Booster for Moderna series) 11/11/2020  ? ? ?There are no preventive care reminders to display for this patient. ? ?Lab Results  ?Component Value Date  ? TSH 2.32 06/28/2013  ? ?Lab Results  ?Component Value Date  ? WBC 7.7 02/01/2022  ? HGB 15.0 02/01/2022  ? HCT 44.3 02/01/2022  ? MCV 94.5 02/01/2022  ? PLT 323 02/01/2022  ? ?Lab Results  ?Component Value Date  ? NA 139 02/01/2022  ? K 4.7 02/01/2022  ? CO2 29 02/01/2022  ? GLUCOSE 90 02/01/2022  ? BUN 11 02/01/2022  ? CREATININE 0.77 02/01/2022  ? BILITOT 0.7 02/01/2022  ? ALKPHOS 68 09/29/2019  ? AST 12 02/01/2022  ? ALT 12 02/01/2022  ? PROT 7.2 02/01/2022  ? ALBUMIN 4.5 09/29/2019  ? CALCIUM 10.1 02/01/2022  ? ANIONGAP 7 09/29/2019  ? EGFR 110 02/01/2022  ? ?No results found for: CHOL ?No results found for: HDL ?No results found for: [place] ?No results found for: TRIG ?No results found for: CHOLHDL ?No results found for: HGBA1C ? ?  ?Assessment & Plan:  ? ?Problem List Items Addressed This Visit   ?None ? ? ?No orders of the defined types were placed in this encounter. ? ? ?Follow-up: No follow-ups on file.  ? ? ?Jessica Medici, DO ?

## 2022-03-05 ENCOUNTER — Encounter: Payer: Self-pay | Admitting: Internal Medicine

## 2022-03-05 ENCOUNTER — Ambulatory Visit (INDEPENDENT_AMBULATORY_CARE_PROVIDER_SITE_OTHER): Payer: BC Managed Care – PPO | Admitting: Internal Medicine

## 2022-03-05 VITALS — BP 114/78 | HR 65 | Temp 97.5°F | Resp 16 | Ht 66.0 in | Wt 163.5 lb

## 2022-03-05 DIAGNOSIS — R110 Nausea: Secondary | ICD-10-CM | POA: Diagnosis not present

## 2022-03-05 DIAGNOSIS — R1013 Epigastric pain: Secondary | ICD-10-CM

## 2022-03-05 NOTE — Progress Notes (Signed)
? ?Established Patient Office Visit ? ?Subjective:  ?Patient ID: Jessica Gates, female    DOB: [birth year]  Age: 27 y.o. MRN: [id number] ? ?CC:  ?Chief Complaint  ?Patient presents with  ? Abdominal Pain  ?  With nausea and vomting  ? ? ?HPI ?Jessica Gates presents for follow up on abdominal pain she's had since 2017 (since  cholecystectomy), generalized in location. Trialed PPI at last visit, which did not help symptoms so she stopped taking it. Went to ER at [facility], last week but wasn't seen due to wait times. Continues to endorse nausea/vomiting on a daily basis - always in the morning once or twice, worse case scenario is vomiting 4-5 times a day. No hematemesis. Had a GI through [facility] who contributed symptoms to IBS. She does have diarrhea daily, sometimes 10 times a day or more. No blood, dark colored stools. LMP 02/15/22, menorrhagia this past time with more cramping than usual but normal periods are regular. Not on birth control and not interested in starting. Following with gyn next week. She continues to smoke marijuana every day. Weight has been stable, appetite decreased.  ? ?Past Medical History:  ?Diagnosis Date  ? ADHD   ? ADHD (attention deficit hyperactivity disorder)   ? Anxiety   ? Bipolar disorder ([place])   ? Depression   ? GERD (gastroesophageal reflux disease)   ? Headache   ? MIGRAINES  ? Hyperlipidemia   ? OCD (obsessive compulsive disorder)   ? Personality disorder ([place])   ? ? ?Past Surgical History:  ?Procedure Laterality Date  ? CHOLECYSTECTOMY N/A 02/22/2016  ? Procedure: LAPAROSCOPIC CHOLECYSTECTOMY WITH INTRAOPERATIVE CHOLANGIOGRAM;  Surgeon: Florene Glen, MD;  Location: [facility] ORS;  Service: General;  Laterality: N/A;  ? CHOLECYSTECTOMY  02/2016  ? ? ?Family History  ?Problem Relation Age of Onset  ? Cholelithiasis Mother   ? Cancer Mother 53  ?     kidney  ? Mental illness Mother   ? Appendicitis Father   ? Cancer Maternal Grandfather   ?     lung and throat  ? Diabetes Paternal  Grandmother   ? ? ?Social History  ? ?Socioeconomic History  ? Marital status: Single  ?  Spouse name: Not on file  ? Number of children: Not on file  ? Years of education: Not on file  ? Highest education level: Not on file  ?Occupational History  ? Not on file  ?Tobacco Use  ? Smoking status: Never  ? Smokeless tobacco: Never  ?Vaping Use  ? Vaping Use: Some days  ? Substances: Nicotine, Flavoring  ?Substance and Sexual Activity  ? Alcohol use: Never  ?  Comment: occ  ? Drug use: Yes  ?  Types: Marijuana  ?  Comment: percocet  ? Sexual activity: Yes  ?Other Topics Concern  ? Not on file  ?Social History Narrative  ? ** Merged History Encounter **  ?    ? ?Social Determinants of Health  ? ?Financial Resource Strain: Not on file  ?Food Insecurity: Not on file  ?Transportation Needs: Not on file  ?Physical Activity: Not on file  ?Stress: Not on file  ?Social Connections: Not on file  ?Intimate Partner Violence: Not on file  ? ? ?Outpatient Medications Prior to Visit  ?Medication Sig Dispense Refill  ? amphetamine-dextroamphetamine (ADDERALL) 10 MG tablet Take 10 mg by mouth 2 (two) times daily.    ? ARIPiprazole (ABILIFY) 10 MG tablet Take 10 mg by mouth daily.    ?  ARIPiprazole ER (ABILIFY MAINTENA) 400 MG SRER injection Inject 400 mg into the muscle every 28 (twenty-eight) days.    ? lithium carbonate 300 MG capsule Take 300 mg by mouth 2 (two) times daily.    ? ondansetron (ZOFRAN) 4 MG tablet Take 1 tablet (4 mg total) by mouth every 8 (eight) hours as needed for nausea, vomiting or refractory nausea / vomiting. 30 tablet 1  ? pantoprazole (PROTONIX) 40 MG tablet Take 1 tablet (40 mg total) by mouth daily. 30 tablet 3  ? ?No facility-administered medications prior to visit.  ? ? ?No Known Allergies ? ?ROS ?Review of Systems  ?Constitutional:  Negative for chills and fever.  ?Gastrointestinal:  Positive for abdominal pain, diarrhea, nausea and vomiting. Negative for blood in stool and constipation.   ?Genitourinary:  Negative for dysuria, flank pain, hematuria and menstrual problem.  ? ?  ?Objective:  ?  ?Physical Exam ?Constitutional:   ?   Appearance: She is well-developed.  ?HENT:  ?   Head: Normocephalic and atraumatic.  ?   Mouth/Throat:  ?   Mouth: Mucous membranes are moist.  ?   Pharynx: Oropharynx is clear.  ?Eyes:  ?   Conjunctiva/sclera: Conjunctivae normal.  ?Cardiovascular:  ?   Rate and Rhythm: Normal rate and regular rhythm.  ?Pulmonary:  ?   Effort: Pulmonary effort is normal.  ?   Breath sounds: Normal breath sounds.  ?Abdominal:  ?   General: Bowel sounds are normal. There is no distension.  ?   Palpations: Abdomen is soft.  ?   Tenderness: There is abdominal tenderness. There is no right CVA tenderness, left CVA tenderness, guarding or rebound.  ?   Comments: Tenderness in LUQ and epigastric region  ?Musculoskeletal:  ?   Right lower leg: No edema.  ?   Left lower leg: No edema.  ?Skin: ?   General: Skin is warm and dry.  ?Neurological:  ?   General: No focal deficit present.  ?   Mental Status: She is alert. Mental status is at baseline.  ?Psychiatric:     ?   Mood and Affect: Mood normal.     ?   Behavior: Behavior normal.  ? ? ?BP 114/78   Pulse 65   Temp (!) 97.5 ?F (36.4 ?C)   Resp 16   Ht _0  (1.676 m)   Wt 163 lb 8 oz (74.2 kg)   BMI 26.39 kg/m?  ?Wt Readings from Last 3 Encounters:  ?03/05/22 163 lb 8 oz (74.2 kg)  ?02/01/22 161 lb 12.8 oz (73.4 kg)  ?03/30/21 164 lb 3.2 oz (74.5 kg)  ? ? ? ?Health Maintenance Due  ?Topic Date Due  ? TETANUS/TDAP  06/09/2017  ? PAP-Cervical Cytology Screening  Never done  ? PAP SMEAR-Modifier  Never done  ? COVID-19 Vaccine (3 - Booster for Moderna series) 11/11/2020  ? ? ?There are no preventive care reminders to display for this patient. ? ?Lab Results  ?Component Value Date  ? TSH 2.32 06/28/2013  ? ?Lab Results  ?Component Value Date  ? WBC 7.7 02/01/2022  ? HGB 15.0 02/01/2022  ? HCT 44.3 02/01/2022  ? MCV 94.5 02/01/2022  ? PLT 323  02/01/2022  ? ?Lab Results  ?Component Value Date  ? NA 139 02/01/2022  ? K 4.7 02/01/2022  ? CO2 29 02/01/2022  ? GLUCOSE 90 02/01/2022  ? BUN 11 02/01/2022  ? CREATININE 0.77 02/01/2022  ? BILITOT 0.7 02/01/2022  ? ALKPHOS 68 09/29/2019  ?  AST 12 02/01/2022  ? ALT 12 02/01/2022  ? PROT 7.2 02/01/2022  ? ALBUMIN 4.5 09/29/2019  ? CALCIUM 10.1 02/01/2022  ? ANIONGAP 7 09/29/2019  ? EGFR 110 02/01/2022  ? ?No results found for: CHOL ?No results found for: HDL ?No results found for: [place] ?No results found for: TRIG ?No results found for: CHOLHDL ?No results found for: HGBA1C ? ?  ?Assessment & Plan:  ? ?1. Chronic nausea/Epigastric pain: Blood work to assess for dehydration/electrolyte disturbance. Referral placed for GI closer for second opinion. We tried PPI which did not help, we did discuss possibility of cyclic vomiting syndrome as she does smoke marijuana daily. Given literature about this, recommend following with GI for further assessment.  ? ?- CBC w/Diff/Platelet ?- COMPLETE METABOLIC PANEL WITH GFR ?- Lipase ?- Ambulatory referral to Gastroenterology ? ? ?Follow-up: Return in about 1 year (around 03/06/2023).  ? ? ?Teodora Medici, DO ?

## 2022-03-05 NOTE — Patient Instructions (Addendum)
It was great seeing you today! ? ?Plan discussed at today's visit: ?-Blood work ordered today, results will be uploaded to MyChart.  ?-Referral to GI placed today as well  ? ?Follow up in: 1 year  ? ?Take care and let Jessica Gates know if you have any questions or concerns prior to your next visit. ? ?Dr. Caralee Ates ? ?Cyclic Vomiting Syndrome, Adult ?Cyclic vomiting syndrome (CVS) is a condition that causes episodes of severe nausea and vomiting. It can last for hours or even days. Attacks may occur several times a month or several times a year. Between episodes of CVS, you may be otherwise healthy. ?What are the causes? ?The cause of this condition is not known. Although many of the episodes can happen for no obvious reason, you may have specific CVS triggers. Episodes may be triggered by: ?An infection, especially colds and the flu. ?Emotional stress, including excitement or anxiety about finances, relationships, or moving. ?Certain foods or beverages, such as chocolate, cheese, alcohol, and food additives. ?Food allergies. ?Motion sickness. ?Eating a large meal before bed. ?Being very tired. ?Being overheated. ?Menstruation. ?Long-term cannabis use. ?What increases the risk? ?You are more likely to develop this condition if: ?You get migraine headaches. ?You have a family history of CVS or migraine headaches. ?What are the signs or symptoms? ?Symptoms tend to happen at the same time of day, and each episode tends to last about the same amount of time. Symptoms commonly start at night or when you wake up. Many people have warning signs (prodrome) before an episode, which may include slight nausea, sweating, and pale skin (pallor). ?The most common symptoms of a CVS attack include: ?Severe vomiting. Vomiting may happen every 5-15 minutes. ?Severe nausea. ?Gagging (retching). ?Other symptoms may include: ?Headache. ?Dizziness. ?Sensitivity to light or sound. ?Abdominal pain. This can be severe. ?Loose stools or  diarrhea. ?Weakness or exhaustion. ?Dehydration. This can cause: ?Thirst. ?Dry mouth. ?Decreased urination. ?Fatigue. ?How is this diagnosed? ?This condition may be diagnosed based on your symptoms, medical history, and family history of CVS or migraine. Your health care provider will ask whether you have had: ?Episodes of severe nausea and vomiting that have happened a total of 5 or more times, or 3 or more times in the past 6 months. ?Episodes that last for 1 hour or more, and occur 1 week apart or further apart. ?Episodes that are similar each time. ?Normal health between episodes. ?Your health care provider will also do a physical exam. To rule out other conditions, you may have tests, such as: ?Blood tests. ?Urine tests. ?Imaging tests. ?How is this treated? ?A prescription pill bottle with an example of a pill. ? ?There is no cure for this condition, but treatment can help manage or prevent CVS episodes. Work with your health care provider to find the best treatment for you. Treatment may include: ?Avoiding stress and CVS triggers. ?Eating smaller, more frequent meals. ?Taking medicines, such as: ?Anti-nausea medicines. ?Antacids. ?Antidepressants. ?Antihistamines. ?Medicines for migraines. ?Over-the-counter pain medicine. ?Over-the counter diet supplements. ?Severe nausea and vomiting may require you to stay at the hospital. You may need IV fluids to prevent or treat dehydration. ?Follow these instructions at home: ?During an episode ?Take over-the-counter and prescription medicines only as told by your health care provider. ?Stay in bed and rest in a dark, quiet room. ?After an episode ?Illustration of a person drinking a glass of water. ? ?Drink an oral rehydration solution (ORS), if directed by your health care provider. This is  a drink that helps you replace fluids and the salts and minerals in your blood (electrolytes). It can be found at pharmacies and retail stores. ?Drink small amounts of clear fluids  slowly and gradually add more. ?Drink clear fluids such as water or fruit juice that has water added (is diluted). You may also eat low-calorie popsicles. ?Avoid drinking fluids that contain a lot of sugar or caffeine, such as sports drinks and soda. ?Eat soft foods in small amounts every 3-4 hours. Eat your regular diet, but avoid spicy or fatty foods, such as french fries and pizza. ?General instructions ?Monitor your condition for any changes. ?Keep track of your attacks and symptoms, and pay attention to any triggers. Avoid those triggers when you can. ?If you use cannabis, stop using it right away. Ending cannabis use can reduce or even stop your cyclic vomiting syndrome. ?Keep all follow-up visits. This is important. ?Where to find more information ?Cyclic Vomiting Syndrome Association: cvsaonline.org ?Contact a health care provider if: ?Your condition gets worse. ?You cannot drink fluids without vomiting. ?You have pain and trouble swallowing after an episode. ?Get help right away if: ?You have blood in your vomit. ?Your vomit looks like coffee grounds. ?You have stools that are bloody or black, or stools that look like tar. ?You have signs of dehydration, such as: ?Sunken eyes. ?Not making tears while crying. ?Very dry mouth or cracked lips. ?Decreased urine production. ?Dark urine. Urine may be the color of tea. ?Weakness. ?Sleepiness. ?These symptoms may be an emergency. Get help right away. Call 911. ?Do not wait to see if the symptoms will go away. ?Do not drive yourself to the hospital. ?Summary ?Cyclic vomiting syndrome (CVS) causes episodes of severe nausea and vomiting that can last for hours or even days. ?Treatment can help you manage or prevent CVS episodes. Work with your health care provider to find the best treatment for you. ?Vomiting and diarrhea can make you feel weak and can lead to dehydration. If you notice signs of dehydration, call your health care provider right away. ?Keep all  follow-up visits This is important. ?This information is not intended to replace advice given to you by your health care provider. Make sure you discuss any questions you have with your health care provider. ?Document Revised: 07/26/2021 Document Reviewed: 07/26/2021 ?Elsevier Patient Education ? 2022 Elsevier Inc. ?  ?

## 2022-03-06 LAB — COMPLETE METABOLIC PANEL WITH GFR
AG Ratio: 1.9 (calc) (ref 1.0–2.5)
ALT: 17 U/L (ref 6–29)
AST: 12 U/L (ref 10–30)
Albumin: 4.5 g/dL (ref 3.6–5.1)
Alkaline phosphatase (APISO): 93 U/L (ref 31–125)
BUN: 10 mg/dL (ref 7–25)
CO2: 28 mmol/L (ref 20–32)
Calcium: 10.2 mg/dL (ref 8.6–10.2)
Chloride: 108 mmol/L (ref 98–110)
Creat: 0.76 mg/dL (ref 0.50–0.96)
Globulin: 2.4 g/dL (calc) (ref 1.9–3.7)
Glucose, Bld: 84 mg/dL (ref 65–99)
Potassium: 5.5 mmol/L — ABNORMAL HIGH (ref 3.5–5.3)
Sodium: 143 mmol/L (ref 135–146)
Total Bilirubin: 0.5 mg/dL (ref 0.2–1.2)
Total Protein: 6.9 g/dL (ref 6.1–8.1)
eGFR: 111 mL/min/{1.73_m2} (ref >=60)

## 2022-03-06 LAB — CBC WITH DIFFERENTIAL/PLATELET
Absolute Monocytes: 382 cells/uL (ref 200–950)
Basophils Absolute: 43 cells/uL (ref 0–200)
Basophils Relative: 0.6 %
Eosinophils Absolute: 158 cells/uL (ref 15–500)
Eosinophils Relative: 2.2 %
HCT: 42.1 % (ref 35.0–45.0)
Hemoglobin: 14.1 g/dL (ref 11.7–15.5)
Lymphs Abs: 1735 cells/uL (ref 850–3900)
MCH: 32 pg (ref 27.0–33.0)
MCHC: 33.5 g/dL (ref 32.0–36.0)
MCV: 95.7 fL (ref 80.0–100.0)
MPV: 10.8 fL (ref 7.5–12.5)
Monocytes Relative: 5.3 %
Neutro Abs: 4882 cells/uL (ref 1500–7800)
Neutrophils Relative %: 67.8 %
Platelets: 309 10*3/uL (ref 140–400)
RBC: 4.4 10*6/uL (ref 3.80–5.10)
RDW: 11.9 % (ref 11.0–15.0)
Total Lymphocyte: 24.1 %
WBC: 7.2 10*3/uL (ref 3.8–10.8)

## 2022-03-06 LAB — LIPASE: Lipase: 12 U/L (ref 7–60)

## 2022-03-06 NOTE — Addendum Note (Signed)
Addended by: Margarita Mail on: 03/06/2022 09:47 AM ? ? Modules accepted: Orders ? ?

## 2022-03-09 ENCOUNTER — Other Ambulatory Visit: Payer: Self-pay

## 2022-03-09 ENCOUNTER — Encounter: Payer: Self-pay | Admitting: Obstetrics and Gynecology

## 2022-03-09 ENCOUNTER — Ambulatory Visit (INDEPENDENT_AMBULATORY_CARE_PROVIDER_SITE_OTHER): Payer: BC Managed Care – PPO | Admitting: Obstetrics and Gynecology

## 2022-03-09 VITALS — BP 90/60 | Ht 65.0 in | Wt 163.0 lb

## 2022-03-09 DIAGNOSIS — Z124 Encounter for screening for malignant neoplasm of cervix: Secondary | ICD-10-CM

## 2022-03-09 DIAGNOSIS — R102 Pelvic and perineal pain: Secondary | ICD-10-CM

## 2022-03-09 NOTE — Progress Notes (Signed)
Patient ID: Jessica Gates, female   DOB: 17-Oct-1995, 27 y.o.   MRN: [id number]  Reason for Consult: Referral (By gastro for endometriosis)   Referred by Teodora Medici, DO  Subjective:     HPI:  Jessica Gates is a 27 y.o. female she is here today for consultation regarding possible endometriosis.  She reports that since 2017 she has been having pain and burning sensation at the scar from her gallbladder surgery.  She feels that this pain is localized in one spot.  She additionally reports that she has been suffering from severe nausea vomiting and diarrhea.  She was started on a PPI and has follow-up with [facility] planned for July 2023.  She is also considering seeing [facility].  She has never had an upper or lower endoscopy.  She reports that her periods occur on a monthly basis.  She generally experiences bleeding for 4 to 6 days.  She does not have heavy bleeding or severe pain that limits her from attending work or school.  She does report that she had 1 period recently that was uncomfortable with cramps have been really asked.  She is expecting her menstrual cycle in a few days and we will monitor this cycle for pain.  She additionally has concerns regarding fertility.  She reports that she has not been using contraception for the last 5 years.  She has been sexually active with 2 different female partners during that time and has not obtain pregnancy.  She reports that she feels like this is a personal problem.  One of her partners has multiple other children with other women.  She is interested in obtaining pregnancy in the near future.  She reports that she has a daily vape and marijuana user.  She reports that this initially was relieving some of her stomach pain but now it does not seem to help.   Gynecological History  Patient's last menstrual period was 02/15/2022 (exact date). Menarche: Middle school  She denies passage of large clots She denies sensations of  gushing or flooding of blood. She denies accidents where she bleeds through her clothing. She denies that she changes a saturated pad or tampon more frequently than every hour.  She denies that pain from her periods limits her activities.  History of fibroids, polyps, or ovarian cysts? :  Reports a remote history of ovarian cyst that was seen on pelvic ultrasound.  She reports that she never had severe pain or surgery from the cyst. History of PCOS? no Hstory of Endometriosis? no History of abnormal pap smears? no Have you had any sexually transmitted infections in the past? no  She reports HPV vaccination in the past.   Last [id number], she declines Pap smear today.  She reports that she is planning to follow-up with her PCP for a Pap smear and pelvic exam is most comfortable if her PCP performs this exam.   She identifies as a female. She is sexually active with men.   She denies dyspareunia. She denies postcoital bleeding.  She currently uses none for contraception.   Obstetrical History G0P0  Past Medical History:  Diagnosis Date   ADHD    ADHD (attention deficit hyperactivity disorder)    Anxiety    Bipolar disorder (HCC)    Depression    GERD (gastroesophageal reflux disease)    Headache    MIGRAINES   Hyperlipidemia    OCD (obsessive compulsive disorder)    Personality disorder ([place])  Family History  Problem Relation Age of Onset   Cholelithiasis Mother    Cancer Mother 38       kidney   Mental illness Mother    Appendicitis Father    Cancer Maternal Grandfather        lung and throat   Diabetes Paternal Grandmother    Past Surgical History:  Procedure Laterality Date   CHOLECYSTECTOMY N/A 02/22/2016   Procedure: LAPAROSCOPIC CHOLECYSTECTOMY WITH INTRAOPERATIVE CHOLANGIOGRAM;  Surgeon: Florene Glen, MD;  Location: [facility] ORS;  Service: General;  Laterality: N/A;   CHOLECYSTECTOMY  02/2016    Short Social History:  Social History   Tobacco Use    Smoking status: Never   Smokeless tobacco: Never  Substance Use Topics   Alcohol use: Never    Comment: occ    No Known Allergies  Current Outpatient Medications  Medication Sig Dispense Refill   amphetamine-dextroamphetamine (ADDERALL) 10 MG tablet Take 10 mg by mouth 2 (two) times daily.     ARIPiprazole (ABILIFY) 10 MG tablet Take 10 mg by mouth daily.     lithium carbonate 300 MG capsule Take 300 mg by mouth 2 (two) times daily.     ondansetron (ZOFRAN) 4 MG tablet Take 1 tablet (4 mg total) by mouth every 8 (eight) hours as needed for nausea, vomiting or refractory nausea / vomiting. 30 tablet 1   No current facility-administered medications for this visit.    Review of Systems  Constitutional: Negative for chills, fatigue, fever and unexpected weight change.  HENT: Negative for trouble swallowing.  Eyes: Negative for loss of vision.  Respiratory: Negative for cough, shortness of breath and wheezing.  Cardiovascular: Negative for chest pain, leg swelling, palpitations and syncope.  GI: Negative for abdominal pain, blood in stool, diarrhea, nausea and vomiting.  GU: Negative for difficulty urinating, dysuria, frequency and hematuria.  Musculoskeletal: Negative for back pain, leg pain and joint pain.  Skin: Negative for rash.  Neurological: Negative for dizziness, headaches, light-headedness, numbness and seizures.  Psychiatric: Negative for behavioral problem, confusion, depressed mood and sleep disturbance.       Objective:  Objective   Vitals:   03/09/22 0852  BP: 90/60  Weight: 163 lb (73.9 kg)  Height: 5\' 5"  (1.651 m)   Body mass index is 27.12 kg/m.  Physical Exam Vitals and nursing note reviewed. Exam conducted with a chaperone present.  Constitutional:      Appearance: Normal appearance. She is well-developed.  HENT:     Head: Normocephalic and atraumatic.  Eyes:     Extraocular Movements: Extraocular movements intact.     Pupils: Pupils are equal,  round, and reactive to light.  Cardiovascular:     Rate and Rhythm: Normal rate and regular rhythm.  Pulmonary:     Effort: Pulmonary effort is normal. No respiratory distress.     Breath sounds: Normal breath sounds.  Abdominal:     General: Abdomen is flat. There is no distension.     Palpations: Abdomen is soft. There is no mass.     Tenderness: There is no abdominal tenderness. There is no guarding or rebound.     Hernia: No hernia is present.  Musculoskeletal:        General: No signs of injury.     Cervical back: Normal range of motion.  Skin:    General: Skin is warm and dry.  Neurological:     General: No focal deficit present.     Mental Status: She is  alert and oriented to person, place, and time.  Psychiatric:        Behavior: Behavior normal.        Thought Content: Thought content normal.        Judgment: Judgment normal.    Assessment/Plan:     27 yo G0P0000 1) Endometriosis evaluation-patient generally does not experience symptoms of dysmenorrhea.  Discussed that currently endometriosis is diagnosed by laparoscopy and she would need laparoscopic surgery to evaluate for endometriosis.  In the future there may be a blood test for endometriosis.  Endometriosis symptoms are generally treated first-line by birth control however the patient desires to conceive in the near future.  Provided with handouts regarding endometriosis and chronic pelvic pain.    2) History of infertility.  Briefly reviewed infertility evaluation.  Suggested that the patient return for further consultation for this issue.  Pelvic ultrasound ordered.  Provided with information regarding infertility treatment and evaluation.  3) Encourage smoking cessation.  Discussed that heavy daily marijuana use can contribute to nausea and vomiting.  Discussed that vaping and marijuana can contribute to infertility.  Provide the patient with information regarding [place] quit line.   4) Declined pap smear today- she  declines Pap smear today.  She reports that she is planning to follow-up with her PCP for a Pap smear and pelvic exam is most comfortable if her PCP performs this exam.  More than 30 minutes were spent face to face with the patient in the room, reviewing the medical record, labs and images, and coordinating care for the patient. The plan of management was discussed in detail and counseling was provided.     Adrian Prows MD [facility], [facility] 03/09/2022 9:00 AM

## 2022-03-09 NOTE — Patient Instructions (Signed)
Female Infertility Female infertility refers to a woman's inability to get pregnant (conceive) after a year of having sex regularly (or after 6 months in women over age 26) without using birth control. Infertility can also mean that a woman is not able to carry a pregnancy to full term. Both women and men can experience fertility problems. What are the causes? This condition may be caused by: Problems with reproductive organs. Infertility can result if a woman: Is not ovulating or is ovulating irregularly. Has a blockage or scarring in the fallopian tubes. Has uterine fibroids. This is a benign mass of tissue or muscle (tumor) that can develop in the uterus. Has an abnormally shaped uterus. Has an abnormally short cervix or a cervix that does not remain closed during a pregnancy. Certain medical conditions. These may include: Polycystic ovary syndrome (PCOS). This is a hormonal disorder that can cause small cysts to grow on the ovaries. This is the most common cause of infertility in women. Endometriosis. This is a condition in which the tissue that lines the uterus (endometrium) grows outside of its normal location. Cancer and cancer treatments, such as chemotherapy or radiation. Premature ovarian failure. This is when ovaries stop producing eggs and hormones before age 40. Sexually transmitted infections, such as chlamydia or gonorrhea. Autoimmune disorders. These are disorders in which the body's defense system (immune system) attacks normal, healthy cells. Infertility can be linked to more than one cause. For some women, the cause of infertility is not known (unexplained infertility). What increases the risk? The following factors may make you more likely to develop this condition: Age. A woman's fertility declines with age, especially after her mid-30s. Stress. Smoking. Being underweight or overweight. Drinking too much alcohol. Using drugs such as anabolic steroids, cocaine, and  marijuana. Exercising excessively. What are the signs or symptoms? The main sign of infertility in women is the inability to get pregnant or carry a pregnancy to full term. How is this diagnosed? This condition may be diagnosed by: Checking whether you are ovulating each month. The tests may include: Blood tests to check hormone levels. An ultrasound of the ovaries. Taking a sample of the tissue that lines the uterus and checking it under a microscope (endometrial biopsy). Doing additional tests. This is done if ovulation is normal. Tests may include: Hysterosalpingogram. This X-ray test can show the shape of the uterus and whether the fallopian tubes are open. Laparoscopy. This test uses a lighted tube (laparoscope) to look for problems in the fallopian tubes and other organs. Transvaginal ultrasound. This imaging test is used to check for abnormalities in the uterus and ovaries. Hysteroscopy. This test uses a lighted tube to check for problems in the cervix and the uterus. To be diagnosed with infertility, both partners will have a physical exam. Both partners will also have an extensive medical and sexual history taken. Additional tests may be done. How is this treated? Treatment depends on the cause of infertility. Most cases of infertility in women are treated with medicine or surgery. Women may take medicine to: Correct ovulation problems. Treat other health conditions. Surgery may be done to: Repair damage to the ovaries, fallopian tubes, cervix, or uterus. Remove growths from the uterus. Remove scar tissue from the uterus, pelvis, or other organs. Assisted reproductive technology (ART) Assisted reproductive technology (ART) refers to all treatments and procedures that combine eggs and sperm outside the body to try to help a couple conceive. ART is often combined with fertility drugs to stimulate ovulation.   Sometimes ART is done using eggs retrieved from another woman's body (donor  eggs) or from previously frozen fertilized eggs (embryos). There are different types of ART. These include: Intrauterine insemination (IUI). A long, thin tube is used to place sperm directly into a woman's uterus. This procedure: Is effective for infertility caused by sperm problems, including low sperm count and low motility. Can be used in combination with fertility drugs. In vitro fertilization (IVF). This is done when a woman's fallopian tubes are blocked or when a man has low sperm count. In this procedure: Fertility drugs are used to stimulate the ovaries to produce multiple eggs. Once mature, these eggs are removed from the body and combined with the sperm to be fertilized. The fertilized eggs are then placed into the woman's uterus. Follow these instructions at home: Lifestyle If you drink alcohol, limit how much you have to 0-1 drink a day. Do not use any products that contain nicotine or tobacco. These products include cigarettes, chewing tobacco, and vaping devices, such as e-cigarettes. If you need help quitting, ask your health care provider. Practice stress reduction techniques that work well for you, such as regular physical activity, meditation, or deep breathing. Make dietary changes to lose weight or maintain a healthy weight. Work with your health care provider and a dietitian to set a weight-loss goal that is healthy and reasonable for you. General instructions Take over-the-counter and prescription medicines only as told by your health care provider. Seek support from a counselor or support group to talk about your concerns related to infertility. Couples counseling may be helpful for you and your partner. Keep all follow-up visits. This is important. Where to find support Resolve - The National Infertility Association: resolve.org Contact a health care provider if: You feel that stress is interfering with your life and relationships. You have side effects from treatments  for infertility. Summary Female infertility refers to a woman's inability to get pregnant (conceive) after a year of having sex regularly (or after 6 months in women over age 26) without using birth control. To be diagnosed with infertility, both partners will have a physical exam. Both partners will also have an extensive medical and sexual history taken. Seek support from a counselor or support group to talk about your concerns related to infertility. Couples counseling may be helpful for you and your partner. This information is not intended to replace advice given to you by your health care provider. Make sure you discuss any questions you have with your health care provider. Document Revised: 02/14/2021 Document Reviewed: 02/14/2021 Elsevier Patient Education  2022 Elsevier Inc.  

## 2022-03-12 ENCOUNTER — Emergency Department: Payer: BC Managed Care – PPO

## 2022-03-12 ENCOUNTER — Encounter: Payer: Self-pay | Admitting: Emergency Medicine

## 2022-03-12 ENCOUNTER — Other Ambulatory Visit: Payer: Self-pay

## 2022-03-12 ENCOUNTER — Emergency Department
Admission: EM | Admit: 2022-03-12 | Discharge: 2022-03-12 | Disposition: A | Discharge status: Home / Self Care | Payer: BC Managed Care – PPO | Attending: Emergency Medicine | Admitting: Emergency Medicine

## 2022-03-12 DIAGNOSIS — W01198A Fall on same level from slipping, tripping and stumbling with subsequent striking against other object, initial encounter: Secondary | ICD-10-CM | POA: Insufficient documentation

## 2022-03-12 DIAGNOSIS — S0990XA Unspecified injury of head, initial encounter: Secondary | ICD-10-CM | POA: Diagnosis present

## 2022-03-12 DIAGNOSIS — S199XXA Unspecified injury of neck, initial encounter: Secondary | ICD-10-CM | POA: Insufficient documentation

## 2022-03-12 DIAGNOSIS — S060X9A Concussion with loss of consciousness of unspecified duration, initial encounter: Secondary | ICD-10-CM | POA: Insufficient documentation

## 2022-03-12 MED ORDER — NAPROXEN 500 MG PO TABS
500.0000 mg | ORAL_TABLET | Freq: Once | ORAL | Status: AC
  Administered 2022-03-12: 500 mg via ORAL
  Filled 2022-03-12: qty 1

## 2022-03-12 MED ORDER — BUTALBITAL-APAP-CAFFEINE 50-325-40 MG PO TABS: 1.0000 | ORAL_TABLET | Freq: Four times a day (QID) | ORAL | 0 refills | Status: AC | PRN

## 2022-03-12 NOTE — ED Triage Notes (Signed)
Pt to ED via POV with co a head injury yesterday, she went to [facility] and they sent her here. She states that she fell and her neck and head are hurting her. She also has and abrasion her left knee and right elbow. No swelling or deformity seen. She is moving her neck around. She did report that she threw up this am.  ?

## 2022-03-12 NOTE — ED Provider Notes (Signed)
? ?[facility] ?Provider Note ? ? ? Event Date/Time  ? First MD Initiated Contact with Patient 03/12/22 1011   ?  (approximate) ? ? ?History  ? ?Neck Injury and Head Injury ? ? ?HPI ? ?Jessica Gates is a 27 y.o. female who presents after a fall that occurred yesterday.  Patient reports she fell backwards and hit her head and injured her neck.  She denies LOC but reports she has had a headache since then.  This morning she had nausea and vomiting.  Denies neurodeficits. ?  ? ? ?Physical Exam  ? ?Triage Vital Signs: ?ED Triage Vitals  ?Enc Vitals Group  ?   BP 03/12/22 1005 (!) 113/93  ?   Pulse Rate 03/12/22 1005 78  ?   Resp 03/12/22 1005 18  ?   Temp 03/12/22 1005 98.4 ?F (36.9 ?C)  ?   Temp src --   ?   SpO2 03/12/22 1005 98 %  ?   Weight 03/12/22 1006 72.6 kg (160 lb)  ?   Height 03/12/22 1006 1.676 m (5\' 6" )  ?   Head Circumference --   ?   Peak Flow --   ?   Pain Score 03/12/22 1006 10  ?   Pain Loc --   ?   Pain Edu? --   ?   Excl. in GC? --   ? ? ?Most recent vital signs: ?Vitals:  ? 03/12/22 1005 03/12/22 1137  ?BP: (!) 113/93 114/76  ?Pulse: 78 77  ?Resp: 18 17  ?Temp: 98.4 ?F (36.9 ?C) 98.5 ?F (36.9 ?C)  ?SpO2: 98% 98%  ? ? ? ?General: Awake, no distress.  ?CV:  Good peripheral perfusion.  ?Resp:  Normal effort.  ?Abd:  No distention.  ?Other:  Head: Tenderness to palpation along the crown, no hematoma or laceration ?Mild C3 tenderness to palpation, no pain with axial load ? ? ?ED Results / Procedures / Treatments  ? ?Labs ?(all labs ordered are listed, but only abnormal results are displayed) ?Labs Reviewed - No data to display ? ? ?EKG ? ? ? ? ?RADIOLOGY ?CT head cervical spine viewed and interpreted by me, no acute abnormality, pending radiology read ? ? ? ?PROCEDURES: ? ?Critical Care performed:  ? ?Procedures ? ? ?MEDICATIONS ORDERED IN ED: ?Medications  ?naproxen (NAPROSYN) tablet 500 mg (500 mg Oral Given 03/12/22 1137)  ? ? ? ?IMPRESSION / MDM / ASSESSMENT AND PLAN / ED  COURSE  ?I reviewed the triage vital signs and the nursing notes. ? ?Patient presents for mechanical fall that occurred yesterday, she has continued headache with nausea and vomiting.  Suspicious for concussion, possibility of intracranial hemorrhage.  Will send for CT head and cervical spine ? ?CT head is reassuring.  Radiology has read as unremarkable. ? ?Treat the patient with analgesics, prescription provided ? ?Avoid additional head injury, outpatient follow-up discussed with patient ? ? ? ?  ? ? ?FINAL CLINICAL IMPRESSION(S) / ED DIAGNOSES  ? ?Final diagnoses:  ?Injury of head, initial encounter  ?Concussion with loss of consciousness, initial encounter  ? ? ? ?Rx / DC Orders  ? ?ED Discharge Orders   ? ?      Ordered  ?  butalbital-acetaminophen-caffeine (FIORICET) 50-325-40 MG tablet  Every 6 hours PRN       ? 03/12/22 1130  ? ?  ?  ? ?  ? ? ? ?Note:  This document was prepared using 03/14/22 and may  include unintentional dictation errors. ?  ?Jene Every, MD ?03/12/22 1139 ? ?

## 2022-03-13 ENCOUNTER — Other Ambulatory Visit: Payer: Self-pay

## 2022-03-13 ENCOUNTER — Emergency Department: Payer: BC Managed Care – PPO

## 2022-03-13 ENCOUNTER — Emergency Department
Admission: EM | Admit: 2022-03-13 | Discharge: 2022-03-13 | Disposition: A | Discharge status: Home / Self Care | Payer: BC Managed Care – PPO | Attending: Emergency Medicine | Admitting: Emergency Medicine

## 2022-03-13 DIAGNOSIS — R519 Headache, unspecified: Secondary | ICD-10-CM | POA: Insufficient documentation

## 2022-03-13 DIAGNOSIS — F0781 Postconcussional syndrome: Secondary | ICD-10-CM | POA: Diagnosis not present

## 2022-03-13 DIAGNOSIS — J3489 Other specified disorders of nose and nasal sinuses: Secondary | ICD-10-CM | POA: Insufficient documentation

## 2022-03-13 LAB — CBC WITH DIFFERENTIAL/PLATELET
Abs Immature Granulocytes: 0.01 10*3/uL (ref 0.00–0.07)
Basophils Absolute: 0 10*3/uL (ref 0.0–0.1)
Basophils Relative: 1 %
Eosinophils Absolute: 0.2 10*3/uL (ref 0.0–0.5)
Eosinophils Relative: 4 %
HCT: 40.7 % (ref 36.0–46.0)
Hemoglobin: 13.7 g/dL (ref 12.0–15.0)
Immature Granulocytes: 0 %
Lymphocytes Relative: 13 %
Lymphs Abs: 0.8 10*3/uL (ref 0.7–4.0)
MCH: 30.9 pg (ref 26.0–34.0)
MCHC: 33.7 g/dL (ref 30.0–36.0)
MCV: 91.7 fL (ref 80.0–100.0)
Monocytes Absolute: 0.5 10*3/uL (ref 0.1–1.0)
Monocytes Relative: 9 %
Neutro Abs: 4.2 10*3/uL (ref 1.7–7.7)
Neutrophils Relative %: 73 %
Platelets: 247 10*3/uL (ref 150–400)
RBC: 4.44 MIL/uL (ref 3.87–5.11)
RDW: 12.1 % (ref 11.5–15.5)
WBC: 5.7 10*3/uL (ref 4.0–10.5)
nRBC: 0 % (ref 0.0–0.2)

## 2022-03-13 LAB — BASIC METABOLIC PANEL
Anion gap: 8 (ref 5–15)
BUN: 17 mg/dL (ref 6–20)
CO2: 25 mmol/L (ref 22–32)
Calcium: 9.3 mg/dL (ref 8.9–10.3)
Chloride: 103 mmol/L (ref 98–111)
Creatinine, Ser: 0.85 mg/dL (ref 0.44–1.00)
GFR, Estimated: >60 mL/min (ref >60)
Glucose, Bld: 91 mg/dL (ref 70–99)
Potassium: 3.8 mmol/L (ref 3.5–5.1)
Sodium: 136 mmol/L (ref 135–145)

## 2022-03-13 MED ORDER — CEPHALEXIN 500 MG PO CAPS
500.0000 mg | ORAL_CAPSULE | Freq: Four times a day (QID) | ORAL | 0 refills | Status: AC
Start: 2022-03-13 — End: 2022-03-23

## 2022-03-13 MED ORDER — SODIUM CHLORIDE 0.9 % IV BOLUS
1000.0000 mL | Freq: Once | INTRAVENOUS | Status: AC
  Administered 2022-03-13: 1000 mL via INTRAVENOUS

## 2022-03-13 MED ORDER — KETOROLAC TROMETHAMINE 30 MG/ML IJ SOLN
30.0000 mg | Freq: Once | INTRAMUSCULAR | Status: AC
  Administered 2022-03-13: 30 mg via INTRAVENOUS
  Filled 2022-03-13: qty 1

## 2022-03-13 MED ORDER — ONDANSETRON HCL 4 MG/2ML IJ SOLN
4.0000 mg | Freq: Once | INTRAMUSCULAR | Status: AC
  Administered 2022-03-13: 4 mg via INTRAVENOUS
  Filled 2022-03-13: qty 2

## 2022-03-13 MED ORDER — ONDANSETRON 4 MG PO TBDP: 4.0000 mg | ORAL_TABLET | Freq: Three times a day (TID) | ORAL | 0 refills | Status: DC | PRN

## 2022-03-13 NOTE — Discharge Instructions (Addendum)
Follow-up with Dr. Marcell Barlow in 48 hours.  Please call his office for an appointment.  Take the antibiotic as prescribed.  Drink plenty of fluids.  Take Tylenol or ibuprofen for pain if needed.  I also sent in a medication for nausea if you need it. ?

## 2022-03-13 NOTE — ED Notes (Signed)
Lavender, light green, blue, red top tubes sent to lab. ?

## 2022-03-13 NOTE — ED Triage Notes (Signed)
Pt was seen and treated yesterday for a fall with head injury, pt states a couple hours after d/c she started having clear drainage from the left nare, denies any worsened HA, still having photophobia.  ?

## 2022-03-13 NOTE — ED Notes (Signed)
Pt c/o HA, pain to back of head, light sensitivity, clear drainage from L nostril, & dizziness when she stands up then sits back down since a fall yesterday. Pt states she was seen yesterday for the fall. Pt denies N/V. ?

## 2022-03-13 NOTE — ED Provider Notes (Signed)
? ?[facility] ?Provider Note ? ? ? Event Date/Time  ? First MD Initiated Contact with Patient 03/13/22 1124   ?  (approximate) ? ? ?History  ? ?Fall and Headache ? ? ?HPI ? ?Jessica Gates is a 27 y.o. female with history of head injury and concussion that was evaluated yesterday with a negative CT of the head and C-spine presents today with continuing headache and clear rhinorrhea from the left nare.  Continues to have a headache and feels weak.  States the inside of her nose on the left side has become very irritated.  No fever or chills, no chest pain or shortness of breath ? ?  ? ? ?Physical Exam  ? ?Triage Vital Signs: ?ED Triage Vitals [03/13/22 1112]  ?Enc Vitals Group  ?   BP 120/65  ?   Pulse Rate 98  ?   Resp 16  ?   Temp 98 ?F (36.7 ?C)  ?   Temp Source Oral  ?   SpO2 98 %  ?   Weight   ?   Height   ?   Head Circumference   ?   Peak Flow   ?   Pain Score   ?   Pain Loc   ?   Pain Edu?   ?   Excl. in GC?   ? ? ?Most recent vital signs: ?Vitals:  ? 03/13/22 1112  ?BP: 120/65  ?Pulse: 98  ?Resp: 16  ?Temp: 98 ?F (36.7 ?C)  ?SpO2: 98%  ? ? ? ?General: Awake, no distress.   ?CV:  Good peripheral perfusion. regular rate and  rhythm ?Resp:  Normal effort. Lungs CTA ?Abd:  No distention.   ?Other:  No septal hematomas noted, no active rhinorrhea noted, pupils equal round and reactive to light, EOMI, no tenderness upon palpation of frontal and maxillary sinuses, some tenderness to ethmoid sinus ? ? ?ED Results / Procedures / Treatments  ? ?Labs ?(all labs ordered are listed, but only abnormal results are displayed) ?Labs Reviewed  ?BASIC METABOLIC PANEL  ?CBC WITH DIFFERENTIAL/PLATELET  ?MISC [facility] TEST (SEND OUT)  ? ? ? ?EKG ? ? ? ? ?RADIOLOGY ?MRI of the brain without contrast for possible CSF fluid leak ? ? ? ?PROCEDURES: ? ? ?Procedures ? ? ?MEDICATIONS ORDERED IN ED: ?Medications  ?ondansetron (ZOFRAN) injection 4 mg (4 mg Intravenous Given 03/13/22 1234)  ?ketorolac (TORADOL) 30  MG/ML injection 30 mg (30 mg Intravenous Given 03/13/22 1234)  ?sodium chloride 0.9 % bolus 1,000 mL (0 mLs Intravenous Stopped 03/13/22 1349)  ? ? ? ?IMPRESSION / MDM / ASSESSMENT AND PLAN / ED COURSE  ?I reviewed the triage vital signs and the nursing notes. ?             ?               ? ?Differential diagnosis includes, but is not limited to, concussion, postconcussion syndrome, CSF fluid leak, sinusitis ? ? ?On review of chart from yesterday, CT of the head and C-spine are both negative for any acute intracranial abnormality or fracture.  Due to the negative CT I will order MRI with concerns of CSF fluid leak ? ?Basic labs ordered also, IV was inserted, patient given medication for nausea.  Will wait for pain medication ? ?MRI of the brain was independently reviewed by me and I do not see any acute abnormality.  Confirmed by radiology ? ?Patient was given Toradol and 1 L normal  saline to help with her headache.  We did obtain a sample of the clear fluid coming from her nose to be evaluated for CSF leak which. ? ?Dr. Cyril Loosen did discuss this with Dr. Marcell Barlow from neurosurgery.  Recommends a beta-2 transferrin test on the fluid to determine if it is CSF fluid.  This is a send out test and should take more than 2 days.  Patient is to be on Keflex per Dr. Marcell Barlow and follow-up in his office. ? ?I did discuss all of this with the patient.  She was placed on Keflex, was instructed to call Dr. Osborne Oman office for follow-up, return emergency department worsening.  Patient states she is feeling somewhat better.  I do not feel that she needs to be admitted as there is no specific CSF leakage noted on MRI or CT from yesterday.  She was discharged stable condition.  Strict instructions to return if worsening ? ? ? ?  ? ? ?FINAL CLINICAL IMPRESSION(S) / ED DIAGNOSES  ? ?Final diagnoses:  ?Postconcussion syndrome  ? ? ? ?Rx / DC Orders  ? ?ED Discharge Orders   ? ?      Ordered  ?  cephALEXin (KEFLEX) 500 MG capsule   4 times daily       ? 03/13/22 1338  ?  ondansetron (ZOFRAN-ODT) 4 MG disintegrating tablet  Every 8 hours PRN       ? 03/13/22 1339  ? ?  ?  ? ?  ? ? ? ?Note:  This document was prepared using Dragon voice recognition software and may include unintentional dictation errors. ? ?  ?Faythe Ghee, PA-C ?03/13/22 1512 ? ?  ?Jene Every, MD ?03/16/22 [phone] ? ?

## 2022-03-13 NOTE — ED Provider Notes (Signed)
Discussed case with Dr. Marcell Barlow of neurosurgery, recommends beta-2 transferrin test, if positive he will see the patient in clinic,.  He notes appropriate for discharge with antibiotics ?  ?Jene Every, MD ?03/13/22 1505 ? ?

## 2022-03-13 NOTE — ED Notes (Signed)
Pt transported to MRI via stretcher with MRI tech. ? ?Dr. Cyril Loosen at bedside. ?

## 2022-03-14 ENCOUNTER — Telehealth: Payer: Self-pay | Admitting: Internal Medicine

## 2022-03-14 ENCOUNTER — Ambulatory Visit: Payer: Self-pay

## 2022-03-14 NOTE — Telephone Encounter (Signed)
Patient triaged, see triage encounter. 

## 2022-03-14 NOTE — Telephone Encounter (Signed)
?  Chief Complaint: poor appetite ?Symptoms: unable to eat food, poor appetite ?Frequency: since Sunday ?Pertinent Negatives: Patient denies nausea or vomiting ?Disposition: [] ED /[] Urgent Care (no appt availability in office) / [x] Appointment(In office/virtual)/ []  [place] [facility]/ [] Home Care/ [] Refused Recommended Disposition /[] [place] [facility]/ []  Follow-up with PCP ?Additional Notes: since Sunday pt states this symptoms began and today dizziness started. Pt was asking about supplements she could take.  ? ?Summary: loss of appetite  ? Mar 14, 2022 11:24 AM A wrote:  ?Reason for CRM: Patient called in to say that she had been at the ER for 2 days and for the past 3 days she have not had an appetite say she tried eating yesterday and just was not able to do it. Asking for a nurse to call her to discuss some possible supplements that she may be able to take since she is not eating. Can be reached at Ph# [phone]   ?  ? ? ? ?Reason for Disposition ? [1] Caller has NON-URGENT question AND [2] triager unable to answer question ? ?Answer Assessment - Initial Assessment Questions ?1. CONCERN: "What happened that made you call today?" ?    Poor appetite and unable to eat food  ?2. ONSET: "When did the sx begin?" (e.g., minutes, hours or days ago) ?    Since Sunday ?11. OTHER SYMPTOMS: "What symptoms are you currently experiencing?" (e.g., none; abnormal heart rate, blackout spells, shakiness, suicidal thoughts, vomiting) ?      Started experiencing dizziness today ? ?Protocols used: Eating Disorders Symptoms and Questions-A-AH ? ?

## 2022-03-14 NOTE — Telephone Encounter (Signed)
Copied from CRM [id number]. Topic: General - Other ?>> Mar 14, 2022 11:24 AM Jaquita Rector A wrote: ?Reason for CRM: Patient called in to say that she had been at the ER for 2 days and for the past 3 days she have not had an appetite say she tried eating yesterday and just was not able to do it. Asking for a nurse to call her to discuss some possible supplements that she may be able to take since she is not eating. Can be reached at Ph# [phone] ?

## 2022-03-15 ENCOUNTER — Encounter: Payer: Self-pay | Admitting: Nurse Practitioner

## 2022-03-15 ENCOUNTER — Telehealth (INDEPENDENT_AMBULATORY_CARE_PROVIDER_SITE_OTHER): Payer: BC Managed Care – PPO | Admitting: Nurse Practitioner

## 2022-03-15 ENCOUNTER — Ambulatory Visit
Admission: RE | Admit: 2022-03-15 | Discharge: 2022-03-15 | Disposition: A | Discharge status: Home / Self Care | Payer: BC Managed Care – PPO | Source: Ambulatory Visit | Attending: Nurse Practitioner | Admitting: Nurse Practitioner

## 2022-03-15 ENCOUNTER — Ambulatory Visit (INDEPENDENT_AMBULATORY_CARE_PROVIDER_SITE_OTHER): Payer: BC Managed Care – PPO | Admitting: Nurse Practitioner

## 2022-03-15 ENCOUNTER — Other Ambulatory Visit: Payer: Self-pay

## 2022-03-15 VITALS — BP 110/82 | HR 100 | Temp 98.3°F | Resp 16 | Ht 66.0 in | Wt 158.9 lb

## 2022-03-15 DIAGNOSIS — R1031 Right lower quadrant pain: Secondary | ICD-10-CM | POA: Diagnosis not present

## 2022-03-15 DIAGNOSIS — W19XXXD Unspecified fall, subsequent encounter: Secondary | ICD-10-CM | POA: Diagnosis not present

## 2022-03-15 DIAGNOSIS — R519 Headache, unspecified: Secondary | ICD-10-CM | POA: Diagnosis not present

## 2022-03-15 DIAGNOSIS — R630 Anorexia: Secondary | ICD-10-CM | POA: Diagnosis not present

## 2022-03-15 LAB — POCT URINALYSIS DIPSTICK
Bilirubin, UA: NEGATIVE
Glucose, UA: NEGATIVE
Nitrite, UA: NEGATIVE
Protein, UA: POSITIVE — AB
Spec Grav, UA: 1.02 (ref 1.010–1.025)
Urobilinogen, UA: 0.2 E.U./dL
pH, UA: 5 (ref 5.0–8.0)

## 2022-03-15 LAB — POCT URINE PREGNANCY: Preg Test, Ur: NEGATIVE

## 2022-03-15 MED ORDER — IOHEXOL 300 MG/ML  SOLN
100.0000 mL | Freq: Once | INTRAMUSCULAR | Status: AC | PRN
  Administered 2022-03-15: 100 mL via INTRAVENOUS

## 2022-03-15 NOTE — Progress Notes (Signed)
? ?BP 110/82   Pulse 100   Temp 98.3 ?F (36.8 ?C) (Oral)   Resp 16   Ht 5\' 6"  (1.676 m)   Wt 158 lb 14.4 oz (72.1 kg)   LMP 02/15/2022 (Exact Date)   SpO2 96%   BMI 25.65 kg/m?   ? ?Subjective:  ? ? Patient ID: Jessica Gates, female    DOB: 18-Nov-1995, 27 y.o.   MRN: [id number] ? ?HPI: ?Jessica Gates is a 27 y.o. female, here alone ? ?Chief Complaint  ?Patient presents with  ? Hospitalization Follow-up  ? ?ER follow up/ headache/fall: She says that she fell on Sunday night and went to the ER at [facility].  According to the er chart patient had fallen back wards and hit her head. She says she was having a headache.  She says it is not bad now, she is having some light sensitivity. They did a ct head and cspine and an MRI: all were negative. She did have some nasal drainage and they sent it off for a beta 2 transferrin test. Prescribed zofran and keflex while at er. She is waiting for the beta 2 transferrin to result and per er notes Dr. Cari Caraway of neurosurgery will see patient in clinic if the test comes back positive.  ? ?Loss of appetite/RLQ pain: She says that since Sunday she has been unable to eat.  She says she just says she cannot eat.  She says she has tried but just can't do it. She says she is able to drink fine, no trouble swallowing. She has an appointment with GI the beginning of May.  She says she is not really nauseated, she just cannot eat.  Upon assessment she was tender in her RLQ she says the pain is sharp. Does not radiate any where.  She does not have rebound tenderness.  She denies any fever.  She says that she did have a BM this am. She says she did have one episode of vomiting this morning. Recent lab work was normal. Ordered STAT ultrasound, they can get her in the morning.  Discussed if she has any worsening pain or develops a fever to go to the emergency department.  Spoke to radiology and they are unable to view appendix with ultrasound, will order ct.   ? ?LMC:Her [facility] was  02/15/2022. Will get urine and upreg, she is unable to provide urine sample at this time. She is going to drop off a sample in the morning.  ? ?Relevant past medical, surgical, family and social history reviewed and updated as indicated. Interim medical history since our last visit reviewed. ?Allergies and medications reviewed and updated. ? ?Review of Systems ? ?Constitutional: Negative for fever or weight change.  ?Respiratory: Negative for cough and shortness of breath.   ?Cardiovascular: Negative for chest pain or palpitations.  ?Gastrointestinal: Positive for abdominal pain upon palpation, no bowel changes. Positive for vomiting, nausea and anorexia  ?Musculoskeletal: Negative for gait problem or joint swelling.  ?Skin: Negative for rash.  ?Neurological: Negative for dizziness or headache.  ?No other specific complaints in a complete review of systems (except as listed in HPI above).  ? ?   ?Objective:  ?  ?BP 110/82   Pulse 100   Temp 98.3 ?F (36.8 ?C) (Oral)   Resp 16   Ht 5\' 6"  (1.676 m)   Wt 158 lb 14.4 oz (72.1 kg)   LMP 02/15/2022 (Exact Date)   SpO2 96%   BMI 25.65 kg/m?   ?  Wt Readings from Last 3 Encounters:  ?03/15/22 158 lb 14.4 oz (72.1 kg)  ?03/12/22 160 lb (72.6 kg)  ?03/09/22 163 lb (73.9 kg)  ?  ?Physical Exam ? ?Constitutional: Patient appears well-developed and well-nourished.  No distress.  ?HEENT: head atraumatic, normocephalic, pupils equal and reactive to light,  neck supple, throat within normal limits ?Cardiovascular: Normal rate, regular rhythm and normal heart sounds.  No murmur heard. No BLE edema. ?Pulmonary/Chest: Effort normal and breath sounds normal. No respiratory distress. ?Abdominal: Soft.  Tenderness to RLQ, No CVA tenderness ?Psychiatric: Patient has a normal mood and affect. behavior is normal. Judgment and thought content normal.  ? ?Results for orders placed or performed during the hospital encounter of 03/13/22  ?Basic metabolic panel  ?Result Value Ref Range  ?  Sodium 136 135 - 145 mmol/L  ? Potassium 3.8 3.5 - 5.1 mmol/L  ? Chloride 103 98 - 111 mmol/L  ? CO2 25 22 - 32 mmol/L  ? Glucose, Bld 91 70 - 99 mg/dL  ? BUN 17 6 - 20 mg/dL  ? Creatinine, Ser 0.85 0.44 - 1.00 mg/dL  ? Calcium 9.3 8.9 - 10.3 mg/dL  ? GFR, Estimated >60 >60 mL/min  ? Anion gap 8 5 - 15  ?CBC with Differential  ?Result Value Ref Range  ? WBC 5.7 4.0 - 10.5 K/uL  ? RBC 4.44 3.87 - 5.11 MIL/uL  ? Hemoglobin 13.7 12.0 - 15.0 g/dL  ? HCT 40.7 36.0 - 46.0 %  ? MCV 91.7 80.0 - 100.0 fL  ? MCH 30.9 26.0 - 34.0 pg  ? MCHC 33.7 30.0 - 36.0 g/dL  ? RDW 12.1 11.5 - 15.5 %  ? Platelets 247 150 - 400 K/uL  ? nRBC 0.0 0.0 - 0.2 %  ? Neutrophils Relative % 73 %  ? Neutro Abs 4.2 1.7 - 7.7 K/uL  ? Lymphocytes Relative 13 %  ? Lymphs Abs 0.8 0.7 - 4.0 K/uL  ? Monocytes Relative 9 %  ? Monocytes Absolute 0.5 0.1 - 1.0 K/uL  ? Eosinophils Relative 4 %  ? Eosinophils Absolute 0.2 0.0 - 0.5 K/uL  ? Basophils Relative 1 %  ? Basophils Absolute 0.0 0.0 - 0.1 K/uL  ? Immature Granulocytes 0 %  ? Abs Immature Granulocytes 0.01 0.00 - 0.07 K/uL  ? ?   ?Assessment & Plan:  ? ?1. Acute nonintractable headache, unspecified headache type ?-will follow up with neurosurgery depending on results of beta 2 transferrin test ? ?2. Fall, subsequent encounter ?- fall on Sunday, no falls since ? ?3. Anorexia ?-keep pushing fluids ?-try and eat small meals ?-has appointment with GI ?- POCT urine pregnancy ?- POCT urinalysis dipstick ?- CT Abdomen Pelvis W Contrast; Future  ? ?4. Right lower quadrant abdominal pain ? ?- POCT urine pregnancy ?- POCT urinalysis dipstick ?- CT Abdomen Pelvis W Contrast; Future  ? ?Follow up plan: ?Return if symptoms worsen or fail to improve. ? ? ? ? ? ?

## 2022-03-15 NOTE — Addendum Note (Signed)
Addended by: Forde Radon on: 03/15/2022 04:20 PM ? ? Modules accepted: Orders ? ?

## 2022-03-15 NOTE — Addendum Note (Signed)
Addended by: Verdelle Valtierra, Sherrill Raring on: 03/15/2022 03:36 PM ? ? Modules accepted: Orders ? ?

## 2022-03-15 NOTE — Telephone Encounter (Signed)
Changed order from ultrasound to ct ?

## 2022-03-15 NOTE — Telephone Encounter (Signed)
Patient notified to not go to [place] appointment and it has been cancelled. Inform [facility] will call with CT appointment ?

## 2022-03-16 ENCOUNTER — Ambulatory Visit: Payer: BC Managed Care – PPO

## 2022-03-16 ENCOUNTER — Other Ambulatory Visit: Payer: Self-pay | Admitting: Nurse Practitioner

## 2022-03-16 ENCOUNTER — Telehealth: Payer: Self-pay

## 2022-03-16 DIAGNOSIS — R112 Nausea with vomiting, unspecified: Secondary | ICD-10-CM

## 2022-03-16 DIAGNOSIS — R109 Unspecified abdominal pain: Secondary | ICD-10-CM

## 2022-03-16 DIAGNOSIS — R630 Anorexia: Secondary | ICD-10-CM

## 2022-03-16 LAB — URINE CULTURE
MICRO NUMBER:: 13140741
Result:: NO GROWTH
SPECIMEN QUALITY:: ADEQUATE

## 2022-03-16 NOTE — Telephone Encounter (Signed)
Pt calling; is having severe pain on her menstrual; legs are shaking; she's real hot; can't do much.  Is there anything OTC she can take or does she need to be seen?  [phone] ?

## 2022-03-16 NOTE — Telephone Encounter (Signed)
LMTC

## 2022-03-16 NOTE — Telephone Encounter (Signed)
Motrin type medicine is first line.  400-800 mg at a time during symptoms.

## 2022-03-16 NOTE — Telephone Encounter (Signed)
Pt aware.

## 2022-03-26 ENCOUNTER — Telehealth: Payer: Self-pay | Admitting: Emergency Medicine

## 2022-03-26 LAB — MISC LABCORP TEST (SEND OUT): Labcorp test code: 9985

## 2022-03-26 NOTE — Telephone Encounter (Signed)
Called patient at request of Dr. Cyril Loosen to check on condition.  I see she has seen pcp since.  Her send out lab test has not resulted, and I informed her that I am working with [facility] lab to check on that pending result.  The patient says she is feeling better now.   ?

## 2022-03-27 NOTE — Telephone Encounter (Signed)
Per lab, the b2 transferrin will not result because of insufficient specimen.  Per Dr. Cherylann Banas should follow up with neurosurgery if she is still having nasal drainage.  Called patient, and she says the drainage is less now, but she does still have nasal drainage.  Instructed her to call neurosurgery for appointment.   ?

## 2022-03-29 ENCOUNTER — Emergency Department: Payer: BC Managed Care – PPO

## 2022-03-29 DIAGNOSIS — Z124 Encounter for screening for malignant neoplasm of cervix: Secondary | ICD-10-CM

## 2022-04-10 ENCOUNTER — Ambulatory Visit: Payer: BC Managed Care – PPO | Admitting: Family Medicine

## 2022-04-11 ENCOUNTER — Ambulatory Visit: Payer: BC Managed Care – PPO | Admitting: Obstetrics and Gynecology

## 2022-04-27 ENCOUNTER — Ambulatory Visit: Payer: Self-pay | Admitting: *Deleted

## 2022-04-27 ENCOUNTER — Ambulatory Visit: Payer: BC Managed Care – PPO | Admitting: Physician Assistant

## 2022-04-27 NOTE — Telephone Encounter (Signed)
?  Chief Complaint: abdominal pain  ?Symptoms: right lower abdominal pain, sharp. Noted after period stops. Constant pain radiates to back right side.  ?Frequency: since 04/20/22 ?Pertinent Negatives: Patient denies chest pain fever, no N/V, no chills no urination issues.  ?Disposition: [] ED /[] Urgent Care (no appt availability in office) / [x] Appointment(In office/virtual)/ []  [place] [facility]/ [] Home Care/ [] Refused Recommended Disposition /[] [facility] [facility]/ []  Follow-up with PCP ?Additional Notes: na ? ? ? ? ? Reason for Disposition ? [1] MILD-MODERATE pain AND [2] constant AND [3] present > 2 hours ?   Seen within 4-6 hours ? ?Answer Assessment - Initial Assessment Questions ?1. LOCATION: "Where does it hurt?"  ?    Lower right side  ?2. RADIATION: "Does the pain shoot anywhere else?" (e.g., chest, back) ?    Right back  ?3. ONSET: "When did the pain begin?" (e.g., minutes, hours or days ago)  ?    After 04/20/22 ?4. SUDDEN: "Gradual or sudden onset?" ?    Gradual  ?5. PATTERN "Does the pain come and go, or is it constant?" ?   - If constant: "Is it getting better, staying the same, or worsening?"  ?    (Note: Constant means the pain never goes away completely; most serious pain is constant and it progresses)  ?   - If intermittent: "How long does it last?" "Do you have pain now?" ?    (Note: Intermittent means the pain goes away completely between bouts) ?    Constant  ?6. SEVERITY: "How bad is the pain?"  (e.g., Scale 1-10; mild, moderate, or severe) ?  - MILD (1-3): doesn't interfere with normal activities, abdomen soft and not tender to touch  ?  - MODERATE (4-7): interferes with normal activities or awakens from sleep, abdomen tender to touch  ?  - SEVERE (8-10): excruciating pain, doubled over, unable to do any normal activities  ?    Tender right side abdomen  ?7. RECURRENT SYMPTOM: "Have you ever had this type of stomach pain before?" If Yes, ask: "When was the last time?" and "What happened  that time?"  ?    Yes MRI did not show anything  ?8. CAUSE: "What do you think is causing the stomach pain?" ?    Not sure  ?9. RELIEVING/AGGRAVATING FACTORS: "What makes it better or worse?" (e.g., movement, antacids, bowel movement) ?    Na  ?10. OTHER SYMPTOMS: "Do you have any other symptoms?" (e.g., back pain, diarrhea, fever, urination pain, vomiting) ?      None ?11. PREGNANCY: "Is there any chance you are pregnant?" "When was your last menstrual period?" ?      na ? ?Protocols used: Abdominal Pain - Female-A-AH ? ?

## 2022-05-01 ENCOUNTER — Ambulatory Visit: Payer: BC Managed Care – PPO | Admitting: Internal Medicine

## 2022-05-01 NOTE — Progress Notes (Deleted)
New Patient Office Visit  Subjective:  Patient ID: Jessica Gates, female    DOB: [birth year]  Age: 27 y.o. MRN: [id number]  CC:  No chief complaint on file.   HPI Jessica Gates presents to establish care. Chronic medical conditions include Bipolar I disorder, ADHD.   Bipolar I/ADHD: -Currently on Abilify 10 mg, Adderall 10 BID and Lithium carbonate 300 -Compliant with medications and denies any side effects -Seeing Psychiatry since 27 years old, has blood work done every few months to check lithium levels.  Abdominal Complaint: Ever since cholecystectomy in 2017 she's had epigastric/bilateral lower quadrant pain every day. Associated with nausea, daily vomiting, diarrhea. Does see a GI in [facility] that diagnosed her with IBS-D but had concerns about possible endometriosis. She has not noticed any association with her menstrual cycles. She does have regular periods, her LMP being 01/17/22. Does have a family history of endometriosis in her mother.   -Duration:  2017 after cholecystectomy  -Frequency: constant -Nature: burning -Location: epigastric, LLQ, and RLQ  -Severity: 10/10 worse in the morning and night  -Radiation: no -Alleviating factors: marijuana -Aggravating factors: movement and eating, not sure about how periods effects the pain -Treatments attempted: Zofran but doesn't help -Constipation: no -Diarrhea: yes -Mucous in the stool: no -Heartburn: no -Bloating:no -Nausea: yes -Vomiting: yes -Episodes of vomit/day: multiple, worse in morning  -Melena or hematochezia: no -Weight loss: yes in 2017 after the surgery but not since  -Change in Appetite: no  Health Maintenance: -Blood work  UTD -Pap due -HPV up to date  Past Medical History:  Diagnosis Date   ADHD    ADHD (attention deficit hyperactivity disorder)    Anxiety    Bipolar disorder (HCC)    Depression    GERD (gastroesophageal reflux disease)    Headache    MIGRAINES   Hyperlipidemia    OCD  (obsessive compulsive disorder)    Personality disorder (HCC)     Past Surgical History:  Procedure Laterality Date   CHOLECYSTECTOMY N/A 02/22/2016   Procedure: LAPAROSCOPIC CHOLECYSTECTOMY WITH INTRAOPERATIVE CHOLANGIOGRAM;  Surgeon: Lattie Haw, MD;  Location: [facility] ORS;  Service: General;  Laterality: N/A;   CHOLECYSTECTOMY  02/2016    Family History  Problem Relation Age of Onset   Cholelithiasis Mother    Cancer Mother 54       kidney   Mental illness Mother    Appendicitis Father    Cancer Maternal Grandfather        lung and throat   Diabetes Paternal Grandmother     Social History   Socioeconomic History   Marital status: Single    Spouse name: Not on file   Number of children: Not on file   Years of education: Not on file   Highest education level: Not on file  Occupational History   Not on file  Tobacco Use   Smoking status: Never   Smokeless tobacco: Never  Vaping Use   Vaping Use: Some days   Substances: Nicotine, Flavoring  Substance and Sexual Activity   Alcohol use: Never    Comment: occ   Drug use: Yes    Types: Marijuana    Comment: percocet   Sexual activity: Not Currently    Birth control/protection: None  Other Topics Concern   Not on file  Social History Narrative   ** Merged History Encounter **       Social Determinants of Health   Financial Resource Strain: Not on file  Food  Insecurity: Not on file  Transportation Needs: Not on file  Physical Activity: Not on file  Stress: Not on file  Social Connections: Not on file  Intimate Partner Violence: Not on file    ROS Review of Systems  Constitutional:  Negative for chills, fever and unexpected weight change.  Respiratory:  Negative for cough and shortness of breath.   Cardiovascular:  Negative for chest pain.  Gastrointestinal:  Positive for abdominal pain, diarrhea, nausea and vomiting. Negative for abdominal distention, blood in stool and constipation.  Genitourinary:   Negative for dysuria, hematuria and menstrual problem.  Neurological:  Negative for dizziness and headaches.   Objective:   Today's Vitals: There were no vitals taken for this visit.  Physical Exam Constitutional:      Appearance: Normal appearance.  HENT:     Head: Normocephalic and atraumatic.  Eyes:     Conjunctiva/sclera: Conjunctivae normal.  Cardiovascular:     Rate and Rhythm: Normal rate and regular rhythm.  Pulmonary:     Effort: Pulmonary effort is normal.     Breath sounds: Normal breath sounds.  Abdominal:     General: Bowel sounds are normal. There is no distension.     Palpations: Abdomen is soft.     Tenderness: There is abdominal tenderness. There is no right CVA tenderness, left CVA tenderness, guarding or rebound.     Comments: Epigastric and RLQ tenderness on exam, no peritoneal signs present  Musculoskeletal:     Right lower leg: No edema.     Left lower leg: No edema.  Skin:    General: Skin is warm and dry.  Neurological:     General: No focal deficit present.     Mental Status: She is alert. Mental status is at baseline.  Psychiatric:        Mood and Affect: Mood normal.        Behavior: Behavior normal.    Assessment & Plan:   1. Epigastric pain/Family history of endometriosis: GI diagnosed her with IBS-D, although she is still complaining of constant epigastric pain, nausea/vomiting daily. She does smoke marijuana to help with her symptoms, consider cycling vomiting syndrome. There were concerns about endometriosis, referral to gynecology placed. Will try PPI trial and have her follow up in 3 months and obtain CBC, CMP.   - CBC w/Diff/Platelet - COMPLETE METABOLIC PANEL WITH GFR - Ambulatory referral to Gynecology  2. Bipolar I disorder (HCC): Following with Psychiatry, doing well on Lithium, Abilify.   3. Attention deficit hyperactivity disorder (ADHD), unspecified ADHD type: Stable, currently on Adderall and seeing Psychiatry.   4. Encounter  for screening for HIV/Need for hepatitis C screening test: Screening for HIV/Hepatitis C today as well.   - HIV antibody (with reflex) - Hepatitis C Antibody   Follow-up: No follow-ups on file.   Margarita Mail, DO

## 2022-06-21 ENCOUNTER — Ambulatory Visit: Payer: Self-pay

## 2022-06-21 NOTE — Telephone Encounter (Signed)
  Chief Complaint: abdominal pain Symptoms: abdominal pain in center, pain constant 5-7/10, nausea, vomiting Frequency: couple of days Pertinent Negatives: NA Disposition: [] ED /[] Urgent Care (no appt availability in office) / [x] Appointment(In office/virtual)/ []  [place] [facility]/ [] Home Care/ [] Refused Recommended Disposition /[] [place] [facility]/ []  Follow-up with PCP Additional Notes: pt was unable to come in today to be seen so scheduled appt for tomorrow at 1440 with Dr. . Care advice given and pt verbalized understanding.     Reason for Disposition  [1] MILD-MODERATE pain AND [2] constant AND [3] present > 2 hours  Answer Assessment - Initial Assessment Questions 1. LOCATION: "Where does it hurt?"      In the center under breasts  2. RADIATION: "Does the pain shoot anywhere else?" (e.g., chest, back)     no 3. ONSET: "When did the pain begin?" (e.g., minutes, hours or days ago)      Couple of days  5. PATTERN "Does the pain come and go, or is it constant?"    - If constant: "Is it getting better, staying the same, or worsening?"      (Note: Constant means the pain never goes away completely; most serious pain is constant and it progresses)     - If intermittent: "How long does it last?" "Do you have pain now?"     (Note: Intermittent means the pain goes away completely between bouts)     Was intermittent but now constant 6. SEVERITY: "How bad is the pain?"  (e.g., Scale 1-10; mild, moderate, or severe)   - MILD (1-3): doesn't interfere with normal activities, abdomen soft and not tender to touch    - MODERATE (4-7): interferes with normal activities or awakens from sleep, abdomen tender to touch    - SEVERE (8-10): excruciating pain, doubled over, unable to do any normal activities      5-7 depending 10. OTHER SYMPTOMS: "Do you have any other symptoms?" (e.g., back pain, diarrhea, fever, urination pain, vomiting)       Nausea, constipation been occurring,  vomiting  Protocols used: Abdominal Pain - Female-A-AH

## 2022-06-22 ENCOUNTER — Ambulatory Visit: Payer: BC Managed Care – PPO | Admitting: Family Medicine

## 2022-06-22 NOTE — Progress Notes (Deleted)
Name: Jessica Gates   MRN: [id number]    DOB: 1995-12-15   Date:06/22/2022       Progress Note  Subjective  Chief Complaint  Abdominal Pain  HPI  *** Patient Active Problem List   Diagnosis Date Noted   Bipolar I disorder (HCC) 02/01/2022   ADHD 10/10/2020   Anxiety and depression 10/10/2020    Past Surgical History:  Procedure Laterality Date   CHOLECYSTECTOMY N/A 02/22/2016   Procedure: LAPAROSCOPIC CHOLECYSTECTOMY WITH INTRAOPERATIVE CHOLANGIOGRAM;  Surgeon: Lattie Haw, MD;  Location: [facility] ORS;  Service: General;  Laterality: N/A;   CHOLECYSTECTOMY  02/2016    Family History  Problem Relation Age of Onset   Cholelithiasis Mother    Cancer Mother 5       kidney   Mental illness Mother    Appendicitis Father    Cancer Maternal Grandfather        lung and throat   Diabetes Paternal Grandmother     Social History   Tobacco Use   Smoking status: Never   Smokeless tobacco: Never  Substance Use Topics   Alcohol use: Never    Comment: occ     Current Outpatient Medications:    amphetamine-dextroamphetamine (ADDERALL) 10 MG tablet, Take 10 mg by mouth 2 (two) times daily., Disp: , Rfl:    ARIPiprazole (ABILIFY) 10 MG tablet, Take 10 mg by mouth daily., Disp: , Rfl:    butalbital-acetaminophen-caffeine (FIORICET) 50-325-40 MG tablet, Take 1-2 tablets by mouth every 6 (six) hours as needed for headache., Disp: 20 tablet, Rfl: 0   lithium carbonate 300 MG capsule, Take 300 mg by mouth 2 (two) times daily., Disp: , Rfl:    ondansetron (ZOFRAN) 4 MG tablet, Take 1 tablet (4 mg total) by mouth every 8 (eight) hours as needed for nausea, vomiting or refractory nausea / vomiting., Disp: 30 tablet, Rfl: 1   ondansetron (ZOFRAN-ODT) 4 MG disintegrating tablet, Take 1 tablet (4 mg total) by mouth every 8 (eight) hours as needed., Disp: 20 tablet, Rfl: 0  No Known Allergies  I personally reviewed active problem list, medication list, allergies, family history, social  history, health maintenance with the patient/caregiver today.   ROS  ***  Objective  There were no vitals filed for this visit.  There is no height or weight on file to calculate BMI.  Physical Exam ***  No results found for this or any previous visit (from the past 2160 hour(s)).   PHQ2/9:    03/15/2022    1:18 PM 03/09/2022    9:13 AM 03/05/2022    2:41 PM 02/01/2022    2:19 PM 10/10/2020   10:46 AM  Depression screen PHQ 2/9  Decreased Interest 0 0 0 1 2  Down, Depressed, Hopeless 0 0 0 3 3  PHQ - 2 Score 0 0 0 4 5  Altered sleeping 1 1 0 0 1  Tired, decreased energy 1 1 0 0 1  Change in appetite 1 0 0 1 2  Feeling bad or failure about yourself  0 0 0 3 3  Trouble concentrating 0 0 0 0 1  Moving slowly or fidgety/restless 0 0 0 0 2  Suicidal thoughts 0 0 0 0 0  PHQ-9 Score 3 2 0 8 15  Difficult doing work/chores Somewhat difficult Not difficult at all Not difficult at all Somewhat difficult Not difficult at all    phq 9 is {gen pos [id number]}   Fall Risk:    03/15/2022  1:18 PM 03/05/2022    2:41 PM 02/01/2022    2:18 PM 10/10/2020   10:28 AM  Fall Risk   Falls in the past year? 1 0 0 0  Number falls in past yr: 0 0 0 0  Injury with Fall? 1 0 0   Risk for fall due to : History of fall(s)  No Fall Risks No Fall Risks  Follow up Falls evaluation completed  Falls prevention discussed Falls evaluation completed      Functional Status Survey:      Assessment & Plan  *** There are no diagnoses linked to this encounter.

## 2022-07-02 ENCOUNTER — Ambulatory Visit: Payer: Self-pay | Admitting: *Deleted

## 2022-07-02 NOTE — Telephone Encounter (Signed)
  Chief Complaint: Abdominal pain Symptoms: Upper mid abdominal pain, constant, 8/10 last few days as worsened. Vomited >7 times today. States has been vomiting "Almost as much for 1 week."  Undigested food "AS soon as I eat." Reports sweating/chills, "Feel terrible". Frequency: 1 week Pertinent Negatives: Patient denies decreased urination, states urine is light. Disposition: [x] ED /[] Urgent Care (no appt availability in office) / [] Appointment(In office/virtual)/ []  [place] [facility]/ [] Home Care/ [] Refused Recommended Disposition /[]  Mobile Bus/ []  Follow-up with PCP Additional Notes: Advised ED, states will follow disposition. Care advise provided. Reason for Disposition  [1] SEVERE pain (e.g., excruciating) AND [2] present > 1 hour  Answer Assessment - Initial Assessment Questions 1. LOCATION: "Where does it hurt?"      Upper abdomen, center 2. RADIATION: "Does the pain shoot anywhere else?" (e.g., chest, back)     No 3. ONSET: "When did the pain begin?" (e.g., minutes, hours or days ago)      1 week ago 4. SUDDEN: "Gradual or sudden onset?"     Constant 5. PATTERN "Does the pain come and go, or is it constant?"    - If constant: "Is it getting better, staying the same, or worsening?"      (Note: Constant means the pain never goes away completely; most serious pain is constant and it progresses)     - If intermittent: "How long does it last?" "Do you have pain now?"     (Note: Intermittent means the pain goes away completely between bouts)     Constant now 6. SEVERITY: "How bad is the pain?"  (e.g., Scale 1-10; mild, moderate, or severe)    - MILD (1-3): doesn't interfere with normal activities, abdomen soft and not tender to touch     - MODERATE (4-7): interferes with normal activities or awakens from sleep, abdomen tender to touch     - SEVERE (8-10): excruciating pain, doubled over, unable to do any normal activities       8/10 7. RECURRENT SYMPTOM: "Have you  ever had this type of stomach pain before?" If Yes, ask: "When was the last time?" and "What happened that time?"      no 8. AGGRAVATING FACTORS: "Does anything seem to cause this pain?" (e.g., foods, stress, alcohol)     After eating 9. CARDIAC SYMPTOMS: "Do you have any of the following symptoms: chest pain, difficulty breathing, sweating, nausea?"      10. OTHER SYMPTOMS: "Do you have any other symptoms?" (e.g., back pain, diarrhea, fever, urination pain, vomiting)       Vomiting  >7 in 24 hour period, Some constipation, urine clear, light  Protocols used: Abdominal Pain - Upper-A-AH

## 2022-07-31 ENCOUNTER — Ambulatory Visit: Payer: Self-pay

## 2022-07-31 NOTE — Telephone Encounter (Signed)
Lost computer and phone connection. When it was restored another call was sent. Unable to complete Triage. Triage was done by another RN.    Answer Assessment - Initial Assessment Questions 1. APPEARANCE of LESION: "What does it look like?"      *No Answer* 2. SIZE: "How big is it?" (e.g., inches, cm; or compare to size of pinhead, tip of pen, eraser, coin, pea, grape, ping pong ball)      Pencil eraser 3. COLOR: "What color is it?" "Is there more than one color?"     Black in center, brown ring around it and then pink 4. SHAPE: "What shape is it?" (e.g., round, irregular)     *No Answer* 5. RAISED: "Does it stick up above the skin or is it flat?" (e.g., raised or elevated)     *No Answer* 6. TENDER: "Does it hurt when you touch it?"  (Scale 1-10; or mild, moderate, severe)     *No Answer* 7. LOCATION: "Where is it located?"      1 on back, 1 on side  and 2 on hands 8. ONSET: "When did it first appear?"      1-1/2 weeks ago 9. NUMBER: "Is there just one?" or "Are there others?"     *No Answer* 10. CAUSE: "What do you think it is?"       *No Answer* 11. OTHER SYMPTOMS: "Do you have any other symptoms?" (e.g., fever)       *No Answer* 12. PREGNANCY: "Is there any chance you are pregnant?" "When was your last menstrual period?"       *No Answer*  Protocols used: Skin Lesion - Moles or Growths-A-AH

## 2022-07-31 NOTE — Telephone Encounter (Signed)
    Chief Complaint: Possible insect bites to right hand, right shoulder, left arm. Black center with a red ring around it. Symptoms: Burning sensation Frequency: 2 weeks Pertinent Negatives: Patient denies fever Disposition: [] ED /[] Urgent Care (no appt availability in office) / [x] Appointment(In office/virtual)/ []  [place] [facility]/ [] Home Care/ [] Refused Recommended Disposition /[] [place] [facility]/ []  Follow-up with PCP Additional Notes:   Reason for Disposition  [1] Red or very tender (to touch) area AND [2] started over 24 hours after the bite  Answer Assessment - Initial Assessment Questions 1. TYPE of INSECT: "What type of insect was it?"      Unsure 2. ONSET: "When did you get bitten?"      2 weeks ago 3. LOCATION: "Where is the insect bite located?"      Right hand, right shoulder, left arm 4. REDNESS: "Is the area red or pink?" If Yes, ask: "What size is area of redness?" (inches or cm). "When did the redness start?"     Yes 5. PAIN: "Is there any pain?" If Yes, ask: "How bad is it?"  (Scale 1-10; or mild, moderate, severe)     [place]urns 6. ITCHING: "Does it itch?" If Yes, ask: "How bad is the itch?"    - MILD: doesn't interfere with normal activities   - MODERATE-SEVERE: interferes with work, school, sleep, or other activities      No 7. SWELLING: "How big is the swelling?" (inches, cm, or compare to coins)     No 8. OTHER SYMPTOMS: "Do you have any other symptoms?"  (e.g., difficulty breathing, hives)     No 9. PREGNANCY: "Is there any chance you are pregnant?" "When was your last menstrual period?"     No  Protocols used: Insect Bite-A-AH

## 2022-08-01 ENCOUNTER — Ambulatory Visit (INDEPENDENT_AMBULATORY_CARE_PROVIDER_SITE_OTHER): Payer: Self-pay | Admitting: Family Medicine

## 2022-08-01 ENCOUNTER — Encounter: Payer: Self-pay | Admitting: Family Medicine

## 2022-08-01 VITALS — BP 110/66 | HR 98 | Temp 97.7°F | Resp 16 | Ht 66.0 in | Wt 149.7 lb

## 2022-08-01 DIAGNOSIS — L989 Disorder of the skin and subcutaneous tissue, unspecified: Secondary | ICD-10-CM

## 2022-08-01 NOTE — Progress Notes (Signed)
Patient ID: Jessica Gates, female    DOB: [birth year], 27 y.o.   MRN: [id number]  PCP: Margarita Mail, DO  Chief Complaint  Patient presents with   Insect Bite    A week and half ago pt assuming it was an insect bite. Has 2 on her right hand, 1 on her upper back and 1 on left shoulder. Pt states the one on the back burns but no itching.    Subjective:   Jessica Gates is a 27 y.o. female, presents to clinic with CC of the following:  HPI  Presents for multiple skin lesions that she first noted about 2 weeks ago.  She believes and then bit her on her back but then had other things develop to her right hand and left upper arm.  She did have her boyfriend push on the bump on her back daily to try and "get it out" she notes the only drainage is mostly clear to yellow and sometimes bloody but no purulence.  She has been picking at the other places, also using peroxide occasionally and she is concerned with delayed healing and prolonged redness and dryness.  She has no pain and there is been no spreading rash or bumps she only has some burning to the lesion on her back which is the largest.  No one else in the home has similar rash or lesions  Patient Active Problem List   Diagnosis Date Noted   Bipolar I disorder (HCC) 02/01/2022   ADHD 10/10/2020   Anxiety and depression 10/10/2020      Current Outpatient Medications:    ALPRAZolam (XANAX) 0.5 MG tablet, Take 0.5 mg by mouth 2 (two) times daily as needed., Disp: , Rfl:    amphetamine-dextroamphetamine (ADDERALL) 20 MG tablet, Take by mouth., Disp: , Rfl:    ARIPiprazole (ABILIFY) 10 MG tablet, Take 10 mg by mouth daily., Disp: , Rfl:    butalbital-acetaminophen-caffeine (FIORICET) 50-325-40 MG tablet, Take 1-2 tablets by mouth every 6 (six) hours as needed for headache., Disp: 20 tablet, Rfl: 0   lithium carbonate 300 MG capsule, Take 300 mg by mouth 2 (two) times daily., Disp: , Rfl:    ondansetron (ZOFRAN-ODT) 4 MG  disintegrating tablet, Take 1 tablet (4 mg total) by mouth every 8 (eight) hours as needed., Disp: 20 tablet, Rfl: 0   amphetamine-dextroamphetamine (ADDERALL) 10 MG tablet, Take 10 mg by mouth 2 (two) times daily. (Patient not taking: Reported on 08/01/2022), Disp: , Rfl:    No Known Allergies   Social History   Tobacco Use   Smoking status: Never   Smokeless tobacco: Never  Vaping Use   Vaping Use: Some days   Substances: Nicotine, Flavoring  Substance Use Topics   Alcohol use: Never    Comment: occ   Drug use: Yes    Types: Marijuana    Comment: percocet      Chart Review Today: I personally reviewed active problem list, medication list, allergies, family history, social history, health maintenance, notes from last encounter, lab results, imaging with the patient/caregiver today.   Review of Systems  Constitutional: Negative.  Negative for activity change, appetite change, chills, diaphoresis, fatigue and fever.  HENT: Negative.    Eyes: Negative.   Respiratory: Negative.  Negative for cough and shortness of breath.   Cardiovascular: Negative.   Gastrointestinal: Negative.   Endocrine: Negative.   Genitourinary: Negative.   Musculoskeletal: Negative.   Skin: Negative.   Allergic/Immunologic: Negative.   Neurological:  Negative.   Hematological: Negative.   Psychiatric/Behavioral: Negative.    All other systems reviewed and are negative.      Objective:   Vitals:   08/01/22 1050  BP: 110/66  Pulse: 98  Resp: 16  Temp: 97.7 F (36.5 C)  TempSrc: Oral  SpO2: 96%  Weight: 149 lb 11.2 oz (67.9 kg)  Height: 5\' 6"  (1.676 m)    Body mass index is 24.16 kg/m.  Physical Exam Vitals and nursing note reviewed.  Constitutional:      General: She is not in acute distress.    Appearance: Normal appearance. She is well-developed. She is not ill-appearing, toxic-appearing or diaphoretic.  HENT:     Head: Normocephalic and atraumatic.     Nose: Nose normal.   Eyes:     General:        Right eye: No discharge.        Left eye: No discharge.     Conjunctiva/sclera: Conjunctivae normal.  Neck:     Trachea: No tracheal deviation.  Cardiovascular:     Rate and Rhythm: Normal rate and regular rhythm.  Pulmonary:     Effort: Pulmonary effort is normal. No respiratory distress.     Breath sounds: No stridor.  Musculoskeletal:        General: Normal range of motion.  Skin:    General: Skin is warm and dry.     Findings: Lesion present. No rash.     Comments: Mid upper right back less than 1x1.5 cm area of mild edema and erythema with central scabbing and excoriations, skin generally dry, mildly ttp w/o fluctuance or induration, no drainage  Two excoriated round raised erythematous papules roughly 2 to 3 mm in diameter located to right dorsal hand radial aspect roughly 2 cm apart, no surrounding edema, induration, erythema and nontender  4 mm excoriated erythematous dry patch to left upper arm nontender without edema or induration, no drainage  Neurological:     Mental Status: She is alert.     Motor: No abnormal muscle tone.     Coordination: Coordination normal.  Psychiatric:        Behavior: Behavior normal.      Results for orders placed or performed in visit on 03/15/22  Urine Culture   Specimen: Urine  Result Value Ref Range   MICRO NUMBER: 03/17/22    SPECIMEN QUALITY: Adequate    Sample Source URINE    STATUS: FINAL    Result: No Growth   POCT urinalysis dipstick  Result Value Ref Range   Color, UA gold    Clarity, UA clear    Glucose, UA Negative Negative   Bilirubin, UA negative    Ketones, UA large    Spec Grav, UA 1.020 1.010 - 1.025   Blood, UA small    pH, UA 5.0 5.0 - 8.0   Protein, UA Positive (A) Negative   Urobilinogen, UA 0.2 0.2 or 1.0 E.U./dL   Nitrite, UA negative    Leukocytes, UA Small (1+) (A) Negative   Appearance clear    Odor none   POCT urine pregnancy  Result Value Ref Range   Preg Test, Ur  Negative Negative       Assessment & Plan:   1. Skin lesion of back Also smaller excoriated small papules to right hand (2) and left upper arm (1) She has been picking at the lesions, having her boyfriend tried to manually express and push on the lesion on her back and  she is also used peroxide and has not used any antibiotic ointment or Vaseline.  Nothing appears to be infected do not suspect abscess or cellulitis.  Lesions began about 2 weeks ago and have not continued to spread so do not feel she needs any oral steroids, do not feel antibiotics are indicated I reviewed wound care with her encouraged her to avoid using hydrogen peroxide, encouraged gentle bathing and patting dry, covering up the areas of broken skin with antibiotic ointment for 1 to 2 days and afterwards using Vaseline, keeping areas covered with bandages that she tends to pick at, and I feel if she is able to to do these wound care principles she should see some improvement in healing in the next 3 to 5 days Encouraged her to follow-up if there is any signs of worsening or infection   She is having lack on insurance and difficulty getting her medications -encouraged her to inquire at [facility] health/community health centers for sliding scale care which may also help with affording her medications.    We also discussed looking up [employer] or [facility] as well as good Rx for her medications      Danelle Berry, PA-C 08/01/22 11:20 AM

## 2022-08-22 ENCOUNTER — Emergency Department (HOSPITAL_COMMUNITY)
Admission: EM | Admit: 2022-08-22 | Discharge: 2022-08-22 | Disposition: A | Discharge status: Home / Self Care | Payer: BC Managed Care – PPO | Attending: Emergency Medicine | Admitting: Emergency Medicine

## 2022-08-22 ENCOUNTER — Encounter (HOSPITAL_COMMUNITY): Payer: Self-pay

## 2022-08-22 ENCOUNTER — Other Ambulatory Visit: Payer: Self-pay

## 2022-08-22 DIAGNOSIS — R21 Rash and other nonspecific skin eruption: Secondary | ICD-10-CM | POA: Insufficient documentation

## 2022-08-22 NOTE — ED Triage Notes (Signed)
Pt reports with a draining abscess to the right side of her head x 4 days. Pt states that she has been noticing new bumps coming on different parts of her body that are just draining clear fluid.

## 2022-08-22 NOTE — Discharge Instructions (Signed)
You were seen today for skin lesions.  These do not appear to be abscesses and do not require antibiotics at this time.  There is no indication at this time for drainage of the wounds.  Please gently cleanse the areas and use a barrier cream or ointment such as Vaseline to protect the areas.  Try to avoid picking at the areas as this will continue to reaggravate the wounds and perhaps prevent them from fully healing.  If he continued to have problems I recommend following up with primary care for possible evaluation or referral to dermatology

## 2022-08-22 NOTE — ED Provider Notes (Signed)
[facility]-EMERGENCY DEPT Provider Note   CSN: [id number] Arrival date & time: 08/22/22  7026     History  Chief Complaint  Patient presents with   Abscess    Jessica Gates is a 27 y.o. female.  Patient presents to the hospital complaining of "abscesses" on the skin.  She states that she has a small bump on her scalp that has been draining some clear fluid.  She also states she has a small bump on the back of her neck and a few more bumps on her arms and upper trunk.  She saw her primary care provider for the same at the beginning of August.  Patient states all drainage has been clear at this time.  At that time there was some bloody drainage.  She does endorse picking at the areas but denies any pruritus.  The lesions are not painful.  She has no one else in the home with similar lesions.  She states the lesions have not gotten worse but they have not gone away as at this time.  Patient has past medical history significant for obsessive-compulsive disorder, personality disorder, ADHD, bipolar disorder, headaches, anxiety, GERD, hyperlipidemia  HPI     Home Medications Prior to Admission medications   Medication Sig Start Date End Date Taking? Authorizing Provider  ALPRAZolam Prudy Feeler) 0.5 MG tablet Take 0.5 mg by mouth 2 (two) times daily as needed. 06/25/22   [provider]  amphetamine-dextroamphetamine (ADDERALL) 10 MG tablet Take 10 mg by mouth 2 (two) times daily. Patient not taking: Reported on 08/01/2022 12/30/21   [provider]  amphetamine-dextroamphetamine (ADDERALL) 20 MG tablet Take by mouth. 07/03/22   [provider]  ARIPiprazole (ABILIFY) 10 MG tablet Take 10 mg by mouth daily. 12/29/21   [provider]  butalbital-acetaminophen-caffeine (FIORICET) 50-325-40 MG tablet Take 1-2 tablets by mouth every 6 (six) hours as needed for headache. 03/12/22 03/12/23  Jene Every, MD  lithium carbonate 300 MG capsule Take  300 mg by mouth 2 (two) times daily. 01/01/22   [provider]  ondansetron (ZOFRAN-ODT) 4 MG disintegrating tablet Take 1 tablet (4 mg total) by mouth every 8 (eight) hours as needed. 03/13/22   Faythe Ghee, PA-C      Allergies    Patient has no known allergies.    Review of Systems   Review of Systems  Skin:  Positive for rash.    Physical Exam Updated Vital Signs BP 116/73 (BP Location: Left Arm)   Pulse 76   Temp 98.5 F (36.9 C) (Oral)   Resp 16   Ht 5\' 6"  (1.676 m)   Wt 66.7 kg   LMP 06/29/2022 (Approximate)   SpO2 100%   BMI 23.73 kg/m  Physical Exam Vitals and nursing note reviewed.  Constitutional:      General: She is not in acute distress. HENT:     Head: Normocephalic and atraumatic.     Mouth/Throat:     Mouth: Mucous membranes are moist.  Eyes:     Conjunctiva/sclera: Conjunctivae normal.  Cardiovascular:     Rate and Rhythm: Normal rate.     Pulses: Normal pulses.  Pulmonary:     Effort: Pulmonary effort is normal.  Musculoskeletal:        General: Normal range of motion.     Cervical back: Normal range of motion and neck supple.  Skin:    General: Skin is warm and dry.     Findings: Lesion present.  Comments: Mid upper right back less than 1x1 cm area with mild erythema.  No fluctuance.  No edema.  Nontender   Two excoriated round raised erythematous roughly 3 mm in diameter located to right dorsal hand radial aspect roughly 2 cm apart, no surrounding edema, fluctuance, erythema and nontender   Neurological:     Mental Status: She is alert.     ED Results / Procedures / Treatments   Labs (all labs ordered are listed, but only abnormal results are displayed) Labs Reviewed - No data to display  EKG None  Radiology No results found.  Procedures Procedures    Medications Ordered in ED Medications - No data to display  ED Course/ Medical Decision Making/ A&P                           Medical Decision Making  The  patient presents today with chief complaint of rash.  Differential clues but is not limited to eczema, abscess, folliculitis, herpes, and others  I reviewed the patient's past medical history including a chart from primary care in early August for the same complaint.  The patient was advised at that time to use gentle bathing, Vaseline, and to quit picking at the wounds.  There is no pain with any of these lesions to suggest an underlying process like a necrotizing fasciitis.  This would have progressed much more rapidly over the past month.  No signs of TENS, Stevens-Johnson's.  There is no fluctuance to suggest abscess in any of the areas evaluated.  The small region on her scalp is less than 1/2 cm in diameter with no erythema.  Very small amount of clear drainage noted at the site.  No signs of abscess or drainable lesion at this time.  I see no signs of cellulitis.  Very mild erythema in the areas with the patient has been irritating the wounds.  I see no need for antibiotics at this time.  I believe the patient's previous plan of continuing with gentle cleansing and a barrier ointment such as Vaseline is perfectly reasonable.  I do recommend to the patient that she follow-up with a primary care for possible referral to dermatology if this continues to be a problem.  I do wonder if this could potentially be related to the patient's obsessive-compulsive disorder.  She appears to be continually aggravating the lesions.  No indication for admission at this time.  Patient may discharge home        Final Clinical Impression(s) / ED Diagnoses Final diagnoses:  Rash and nonspecific skin eruption    Rx / DC Orders ED Discharge Orders     None         Pamala Duffel 08/22/22 5329    Glyn Ade, MD 08/22/22 1125

## 2022-09-17 ENCOUNTER — Encounter: Payer: Self-pay | Admitting: Nurse Practitioner

## 2022-09-17 ENCOUNTER — Ambulatory Visit: Payer: Self-pay | Admitting: *Deleted

## 2022-09-17 ENCOUNTER — Other Ambulatory Visit: Payer: Self-pay

## 2022-09-17 ENCOUNTER — Ambulatory Visit: Payer: Self-pay | Admitting: Nurse Practitioner

## 2022-09-17 VITALS — BP 110/72 | HR 85 | Temp 98.1°F | Resp 18 | Ht 66.0 in | Wt 147.3 lb

## 2022-09-17 DIAGNOSIS — R110 Nausea: Secondary | ICD-10-CM

## 2022-09-17 DIAGNOSIS — R630 Anorexia: Secondary | ICD-10-CM

## 2022-09-17 DIAGNOSIS — R1115 Cyclical vomiting syndrome unrelated to migraine: Secondary | ICD-10-CM

## 2022-09-17 DIAGNOSIS — R112 Nausea with vomiting, unspecified: Secondary | ICD-10-CM

## 2022-09-17 MED ORDER — PROMETHAZINE HCL 12.5 MG RE SUPP: 12.5000 mg | Freq: Four times a day (QID) | RECTAL | 0 refills | Status: DC | PRN

## 2022-09-17 NOTE — Progress Notes (Signed)
BP 110/72   Pulse 85   Temp 98.1 F (36.7 C) (Oral)   Resp 18   Ht 5\' 6"  (1.676 m)   Wt 147 lb 4.8 oz (66.8 kg)   SpO2 100%   BMI 23.77 kg/m    Subjective:    Patient ID: Jessica Gates, female    DOB: 04-Aug-1995, 27 y.o.   MRN: [id number]  HPI: Jessica Gates is a 27 y.o. female  Chief Complaint  Patient presents with   Emesis    For 1 week   Diarrhea    Started on yesterday   Vomiting/diarrhea/anorexia: She reports she has had nausea and vomiting for 1 week.  Patient states diarrhea started yesterday.  [facility]: 09/03/2022 she reports these are the same symptoms she has been dealing with for years.  States it comes and goes.  Patient states she has been seen by GI at [facility].  Patient states they did a bunch of blood work and they did stool cultures but they can never really tell her what was wrong other than they told her she had irritable bowel syndrome.  Patient continues to have issues.  Patient would like to see a new gastroenterologist.  At previous visits patient has been referred to other GI specialist.  Most recently she was referred to 1 in [place] and they stated that they could not see her until she closed her case over at [facility].  She does not want to go back to [facility].  We will place new referral to [facility] clinic gastroenterology.  States she has been unable to eat due to the vomiting.  She states she just does not want to eat because she knows she is going to be vomiting.  Patient reports that the Zofran has not helped her at all and she still not really able to eat.  Discussed with patient trying Phenergan suppository.  Patient is willing to try.  Prescription sent in.  Also given good Rx card .  Patient states the best that she can do is drink water and drink Gatorade.  Discussed following the brat diet bananas rice apples toast.  We will also be getting labs and calprotectin.   Relevant past medical, surgical, family and social history reviewed and updated as indicated.  Interim medical history since our last visit reviewed. Allergies and medications reviewed and updated.  Review of Systems  Constitutional: Negative for fever or weight change.  Respiratory: Negative for cough and shortness of breath.   Cardiovascular: Negative for chest pain or palpitations.  Gastrointestinal: positive for abdominal pain, nausia, vomiting and diarrhea Musculoskeletal: Negative for gait problem or joint swelling.  Skin: Negative for rash.  Neurological: Negative for dizziness or headache.  No other specific complaints in a complete review of systems (except as listed in HPI above).      Objective:    BP 110/72   Pulse 85   Temp 98.1 F (36.7 C) (Oral)   Resp 18   Ht 5\' 6"  (1.676 m)   Wt 147 lb 4.8 oz (66.8 kg)   SpO2 100%   BMI 23.77 kg/m   Wt Readings from Last 3 Encounters:  09/17/22 147 lb 4.8 oz (66.8 kg)  08/22/22 147 lb (66.7 kg)  08/01/22 149 lb 11.2 oz (67.9 kg)    Physical Exam  Constitutional: Patient appears well-developed and well-nourished. No distress.  HEENT: head atraumatic, normocephalic, pupils equal and reactive to light,  neck supple Cardiovascular: Normal rate, regular rhythm and normal heart  sounds.  No murmur heard. No BLE edema. Pulmonary/Chest: Effort normal and breath sounds normal. No respiratory distress. Abdominal: Soft.  There is no tenderness. Psychiatric: Patient has a normal mood and affect. behavior is normal. Judgment and thought content normal.  Results for orders placed or performed in visit on 03/15/22  Urine Culture   Specimen: Urine  Result Value Ref Range   MICRO NUMBER: [id number]    SPECIMEN QUALITY: Adequate    Sample Source URINE    STATUS: FINAL    Result: No Growth   POCT urinalysis dipstick  Result Value Ref Range   Color, UA gold    Clarity, UA clear    Glucose, UA Negative Negative   Bilirubin, UA negative    Ketones, UA large    Spec Grav, UA 1.020 1.010 - 1.025   Blood, UA small    pH, UA 5.0  5.0 - 8.0   Protein, UA Positive (A) Negative   Urobilinogen, UA 0.2 0.2 or 1.0 E.U./dL   Nitrite, UA negative    Leukocytes, UA Small (1+) (A) Negative   Appearance clear    Odor none   POCT urine pregnancy  Result Value Ref Range   Preg Test, Ur Negative Negative      Assessment & Plan:   Problem List Items Addressed This Visit   None Visit Diagnoses     Chronic nausea    -  Primary   Referral placed to GI, start Phenergan suppositories, follow BRAT diet, being labs and calprotectin   Relevant Medications   promethazine (PHENERGAN) 12.5 MG suppository   Other Relevant Orders   Ambulatory referral to Gastroenterology   Anorexia       Referral placed to GI, start Phenergan suppositories, follow BRAT diet, being labs and calprotectin   Relevant Medications   promethazine (PHENERGAN) 12.5 MG suppository   Other Relevant Orders   Ambulatory referral to Gastroenterology   Nausea and vomiting, unspecified vomiting type       Referral placed to GI, start Phenergan suppositories, follow BRAT diet, being labs and calprotectin   Relevant Medications   promethazine (PHENERGAN) 12.5 MG suppository   Other Relevant Orders   Ambulatory referral to Gastroenterology   CBC with Differential/Platelet   COMPLETE METABOLIC PANEL WITH GFR   Cyclic vomiting syndrome       Referral placed to GI, start Phenergan suppositories, follow BRAT diet, being labs and calprotectin   Relevant Medications   promethazine (PHENERGAN) 12.5 MG suppository   Other Relevant Orders   Ambulatory referral to Gastroenterology   CALPROTECTIN        Follow up plan: Return if symptoms worsen or fail to improve.

## 2022-09-17 NOTE — Telephone Encounter (Signed)
Reason for Disposition  [1] MILD or MODERATE vomiting AND [2] present > 48 hours (2 days) (Exception: Mild vomiting with associated diarrhea.)  Answer Assessment - Initial Assessment Questions 1. VOMITING SEVERITY: "How many times have you vomited in the past 24 hours?"     - MILD:  1 - 2 times/day    - MODERATE: 3 - 5 times/day, decreased oral intake without significant weight loss or symptoms of dehydration    - SEVERE: 6 or more times/day, vomits everything or nearly everything, with significant weight loss, symptoms of dehydration      Moderate/severe 2. ONSET: "When did the vomiting begin?"      1 week 3. FLUIDS: "What fluids or food have you vomited up today?" "Have you been able to keep any fluids down?"     Sometimes Gatorade and water-no solids 4. ABDOMEN PAIN: "Are your having any abdomen pain?" If Yes : "How bad is it and what does it feel like?" (e.g., crampy, dull, intermittent, constant)      Yes- aches- on/off 5. DIARRHEA: "Is there any diarrhea?" If Yes, ask: "How many times today?"      Yesterday- once 6. CONTACTS: "Is there anyone else in the family with the same symptoms?"      no 7. CAUSE: "What do you think is causing your vomiting?"     Unsure-chronic 8. HYDRATION STATUS: "Any signs of dehydration?" (e.g., dry mouth [not only dry lips], too weak to stand) "When did you last urinate?"     Weight loss, urinating normally 9. OTHER SYMPTOMS: "Do you have any other symptoms?" (e.g., fever, headache, vertigo, vomiting blood or coffee grounds, recent head injury)     sweats with cold- at times 10. PREGNANCY: "Is there any chance you are pregnant?" "When was your last menstrual period?"       No- LMP- normal  Protocols used: Vomiting-A-AH

## 2022-09-17 NOTE — Telephone Encounter (Signed)
  Chief Complaint: chronic vomiting Symptoms: vomiting, diarrhea Frequency: 1 week Pertinent Negatives: Patient denies fever, headache, vertigo, vomiting blood or coffee grounds, recent head injury Disposition: [] ED /[] Urgent Care (no appt availability in office) / [x] Appointment(In office/virtual)/ []  [place] [facility]/ [] Home Care/ [] Refused Recommended Disposition /[] [place] [facility]/ []  Follow-up with PCP Additional Notes:

## 2022-09-18 ENCOUNTER — Other Ambulatory Visit: Payer: Self-pay | Admitting: Nurse Practitioner

## 2022-09-18 ENCOUNTER — Telehealth: Payer: Self-pay

## 2022-09-18 DIAGNOSIS — R1115 Cyclical vomiting syndrome unrelated to migraine: Secondary | ICD-10-CM

## 2022-09-18 DIAGNOSIS — R630 Anorexia: Secondary | ICD-10-CM

## 2022-09-18 DIAGNOSIS — R112 Nausea with vomiting, unspecified: Secondary | ICD-10-CM

## 2022-09-18 DIAGNOSIS — R110 Nausea: Secondary | ICD-10-CM

## 2022-09-18 LAB — COMPLETE METABOLIC PANEL WITH GFR
AG Ratio: 1.7 (calc) (ref 1.0–2.5)
ALT: 18 U/L (ref 6–29)
AST: 14 U/L (ref 10–30)
Albumin: 4.7 g/dL (ref 3.6–5.1)
Alkaline phosphatase (APISO): 82 U/L (ref 31–125)
BUN: 13 mg/dL (ref 7–25)
CO2: 24 mmol/L (ref 20–32)
Calcium: 10 mg/dL (ref 8.6–10.2)
Chloride: 104 mmol/L (ref 98–110)
Creat: 0.64 mg/dL (ref 0.50–0.96)
Globulin: 2.7 g/dL (calc) (ref 1.9–3.7)
Glucose, Bld: 86 mg/dL (ref 65–99)
Potassium: 4.4 mmol/L (ref 3.5–5.3)
Sodium: 139 mmol/L (ref 135–146)
Total Bilirubin: 0.6 mg/dL (ref 0.2–1.2)
Total Protein: 7.4 g/dL (ref 6.1–8.1)
eGFR: 125 mL/min/{1.73_m2} (ref >=60)

## 2022-09-18 LAB — CBC WITH DIFFERENTIAL/PLATELET
Absolute Monocytes: 361 cells/uL (ref 200–950)
Basophils Absolute: 42 cells/uL (ref 0–200)
Basophils Relative: 0.5 %
Eosinophils Absolute: 8 cells/uL — ABNORMAL LOW (ref 15–500)
Eosinophils Relative: 0.1 %
HCT: 41.3 % (ref 35.0–45.0)
Hemoglobin: 14.1 g/dL (ref 11.7–15.5)
Lymphs Abs: 1445 cells/uL (ref 850–3900)
MCH: 32.3 pg (ref 27.0–33.0)
MCHC: 34.1 g/dL (ref 32.0–36.0)
MCV: 94.5 fL (ref 80.0–100.0)
MPV: 10.8 fL (ref 7.5–12.5)
Monocytes Relative: 4.3 %
Neutro Abs: 6544 cells/uL (ref 1500–7800)
Neutrophils Relative %: 77.9 %
Platelets: 307 10*3/uL (ref 140–400)
RBC: 4.37 10*6/uL (ref 3.80–5.10)
RDW: 12 % (ref 11.0–15.0)
Total Lymphocyte: 17.2 %
WBC: 8.4 10*3/uL (ref 3.8–10.8)

## 2022-09-18 MED ORDER — PROMETHAZINE HCL 12.5 MG RE SUPP: 12.5000 mg | Freq: Four times a day (QID) | RECTAL | 0 refills | Status: DC | PRN

## 2022-09-18 NOTE — Telephone Encounter (Signed)
Please send suppositories

## 2022-09-18 NOTE — Telephone Encounter (Signed)
Pt called and wanted to go over lab results. Advised that labs were normal. Pt wanted to know if she needed to be concerned about eosinophils being low. I advised no and was just component of WBC which were normal. Pt verbalized understanding and states that her mom tried going to get her medication yesterday from [facility] but they were out of stock of the Phenergan supp. And pt is asking if can be sent to [facility] instead. Advised her I would send to [place] for review. No further assistance needed.

## 2022-09-23 ENCOUNTER — Encounter: Payer: Self-pay | Admitting: Nurse Practitioner

## 2022-10-02 ENCOUNTER — Other Ambulatory Visit: Payer: Self-pay | Admitting: Nurse Practitioner

## 2022-10-02 DIAGNOSIS — R110 Nausea: Secondary | ICD-10-CM

## 2022-10-02 DIAGNOSIS — R1115 Cyclical vomiting syndrome unrelated to migraine: Secondary | ICD-10-CM

## 2022-10-02 DIAGNOSIS — R630 Anorexia: Secondary | ICD-10-CM

## 2022-10-02 DIAGNOSIS — R112 Nausea with vomiting, unspecified: Secondary | ICD-10-CM

## 2022-10-02 MED ORDER — PROMETHAZINE HCL 12.5 MG RE SUPP: 12.5000 mg | Freq: Four times a day (QID) | RECTAL | 0 refills | Status: DC | PRN

## 2022-10-11 ENCOUNTER — Other Ambulatory Visit: Payer: Self-pay | Admitting: Nurse Practitioner

## 2022-10-11 DIAGNOSIS — R630 Anorexia: Secondary | ICD-10-CM

## 2022-10-11 DIAGNOSIS — R112 Nausea with vomiting, unspecified: Secondary | ICD-10-CM

## 2022-10-11 DIAGNOSIS — R1115 Cyclical vomiting syndrome unrelated to migraine: Secondary | ICD-10-CM

## 2022-10-11 DIAGNOSIS — R110 Nausea: Secondary | ICD-10-CM

## 2022-10-17 ENCOUNTER — Ambulatory Visit: Payer: Self-pay

## 2022-10-17 NOTE — Telephone Encounter (Signed)
Chief Complaint: Upper abdominal pain, vomiting Symptoms: Pain 10/10 all the time in the mornings and evenings, weakness Frequency: Ongoing for years, just getting worst  Pertinent Negatives: Patient denies Fever Disposition: [] ED /[] Urgent Care (no appt availability in office) / [x] Appointment(In office/virtual)/ []  [facility]/ [] Home Care/ [] Refused Recommended Disposition /[] [facility]/ []  Follow-up with PCP Additional Notes: N/A   Reason for Disposition  [1] MODERATE pain (e.g., interferes with normal activities) AND [2] comes and goes (cramps) AND [3] present > 24 hours  (Exception: Pain with Vomiting or Diarrhea - see that Guideline.)  Answer Assessment - Initial Assessment Questions 1. LOCATION: "Where does it hurt?"      Center underneath breasts, burning pain 2. RADIATION: "Does the pain shoot anywhere else?" (e.g., chest, back)     No 3. ONSET: "When did the pain begin?" (e.g., minutes, hours or days ago)      Ongoing issue 4. SUDDEN: "Gradual or sudden onset?"     Gradual 5. PATTERN "Does the pain come and go, or is it constant?"    - If it comes and goes: "How long does it last?" "Do you have pain now?"     (Note: Comes and goes means the pain is intermittent. It goes away completely between bouts.)    - If constant: "Is it getting better, staying the same, or getting worse?"      (Note: Constant means the pain never goes away completely; most serious pain is constant and gets worse.)      Constant 6. SEVERITY: "How bad is the pain?"  (e.g., Scale 1-10; mild, moderate, or severe)    - MILD (1-3): Doesn't interfere with normal activities, abdomen soft and not tender to touch..     - MODERATE (4-7): Interferes with normal activities or awakens from sleep, abdomen tender to touch.     - SEVERE (8-10): Excruciating pain, doubled over, unable to do any normal activities.       10 all the time, mornings and later in the day are worse 7. RECURRENT  SYMPTOM: "Have you ever had this type of stomach pain before?" If Yes, ask: "When was the last time?" and "What happened that time?"      Yes 8. AGGRAVATING FACTORS: "Does anything seem to cause this pain?" (e.g., foods, stress, alcohol)     Anything 9. CARDIAC SYMPTOMS: "Do you have any of the following symptoms: chest pain, difficulty breathing, sweating, nausea?"     N/A 10. OTHER SYMPTOMS: "Do you have any other symptoms?" (e.g., back pain, diarrhea, fever, urination pain, vomiting)       Vomiting 3 times this morning, weakness 11. PREGNANCY: "Is there any chance you are pregnant?" "When was your last menstrual period?"       No  Protocols used: Abdominal Pain - Upper-A-AH

## 2022-10-18 ENCOUNTER — Ambulatory Visit: Payer: Self-pay | Admitting: *Deleted

## 2022-10-18 ENCOUNTER — Ambulatory Visit: Payer: Self-pay | Admitting: Internal Medicine

## 2022-10-18 NOTE — Telephone Encounter (Signed)
Message from Oneta Rack sent at 10/18/2022  1:24 PM EDT  Summary: VOMITING   Patient received call from practice stating (see below)   Per flow coordinator Cassandra pt is more than welcome to come in for her appointment; however, if it is in regards to the same issue, she will not be prescribed medication and or possibly PCP will not be able to help her. Again, if this is the same issue she came in for previously.   Caller states promethazine (PHENERGAN) 12.5 MG suppository helped with vomiting and would like PCP to refill.   GI can not see patient until 01/08/2022 and patient unsure what to do until appointment with specialist. Last time patient vomited was this morning. She is currently at work. Please return mother call.           Call History   Type Contact Phone/Fax User  10/18/2022 01:20 PM EDT Phone (Incoming) Jessica Gates,Jessica Gates (Mother) [phone] Claudia Desanctis    Reason for Disposition  [1] Caller has URGENT medicine question about med that PCP or specialist prescribed AND [2] triager unable to answer question  Answer Assessment - Initial Assessment Questions 1. NAME of MEDICINE: "What medicine(s) are you calling about?"     Phenergan supp. 2. QUESTION: "What is your question?" (e.g., double dose of medicine, side effect)     Mother wants to know if it can be refilled for her until Jessica Gates can be seen by the GI dr. On 01/08/2023.   The box she had with 14 supp. In it has been used up.   It does help with the daily vomiting Runell is having. 3. PRESCRIBER: "Who prescribed the medicine?" Reason: if prescribed by specialist, call should be referred to that group.     Serafina Royals and Dr. Rosana Berger 4. SYMPTOMS: "Do you have any symptoms?" If Yes, ask: "What symptoms are you having?"  "How bad are the symptoms (e.g., mild, moderate, severe)     Daily Vomiting, ongoing issue 5. PREGNANCY:  "Is there any chance that you are pregnant?" "When was your last menstrual period?"      N/A  Protocols used: Medication Question Call-A-AH

## 2022-10-18 NOTE — Telephone Encounter (Signed)
  Chief Complaint: I returned call to mother Jessica Gates as she had called in requesting to speak with someone regarding her daughter Jessica Gates and getting a refill for the Phenergan Suppositories for the daily vomiting she is having.   The GI dr. Sharyl Nimrod see her until 01/08/2023.   The Phenergan really helps with the vomiting but it was refused for a refill.     Jessica Gates had a 2:00 appt. Today with Dr. Rosana Berger but she is not coming.   (See chart for message from flow coordinator at [facility])  I called [facility] and let them know Jessica Gates was not coming in for the 2:00 appt.     They requested I send over the refill request for the Phenergan and they will check with the provider.   Refill message sent to practice. Symptoms: Jessica Gates is vomiting daily and is weak. Frequency: Daily vomiting is an ongoing issue. Pertinent Negatives: Patient denies N/A Disposition: [] ED /[] Urgent Care (no appt availability in office) / [] Appointment(In office/virtual)/ []  [facility]/ [] Home Care/ [] Refused Recommended Disposition /[]  Mobile Bus/ [x]  Follow-up with PCP Additional Notes: Message sent to [facility].

## 2022-10-19 ENCOUNTER — Other Ambulatory Visit: Payer: Self-pay | Admitting: Internal Medicine

## 2022-10-19 DIAGNOSIS — R112 Nausea with vomiting, unspecified: Secondary | ICD-10-CM

## 2022-10-19 DIAGNOSIS — R1115 Cyclical vomiting syndrome unrelated to migraine: Secondary | ICD-10-CM

## 2022-10-19 DIAGNOSIS — R630 Anorexia: Secondary | ICD-10-CM

## 2022-10-19 DIAGNOSIS — R110 Nausea: Secondary | ICD-10-CM

## 2022-10-19 MED ORDER — PROMETHAZINE HCL 12.5 MG RE SUPP: 12.5000 mg | Freq: Three times a day (TID) | RECTAL | 0 refills | Status: DC | PRN

## 2022-12-04 NOTE — Progress Notes (Deleted)
   Acute Office Visit  Subjective:     Patient ID: Jessica Gates, female    DOB: [birth year], 27 y.o.   MRN: [id number]  No chief complaint on file.   HPI Patient is in today for blood/fluid in ear.  EAR PAIN Duration: {Blank single:19197::"days","weeks","months"} Involved ear(s): {Blank single:19197::"left","right","bilateral"} Severity:  {Blank single:19197::"mild","moderate","severe","1/10","2/10","3/10","4/10","5/10","6/10","7/10","8/10","9/10","10/10"}  Quality:  {Blank multiple:19196::"sharp","dull","aching","burning","cramping","ill-defined","itchy","pressure-like","pulling","shooting","sore","stabbing","tender","tearing","throbbing"} Fever: {Blank single:19197::"yes","no"} Otorrhea: {Blank single:19197::"yes","no"} Upper respiratory infection symptoms: {Blank single:19197::"yes","no"} Pruritus: {Blank single:19197::"yes","no"} Hearing loss: {Blank single:19197::"yes","no"} Water immersion {Blank single:19197::"yes","no"} Using Q-tips: {Blank single:19197::"yes","no"} Recurrent otitis media: {Blank single:19197::"yes","no"} Status: {Blank multiple:19196::"better","worse","stable","fluctuating"} Treatments attempted: {Blank single:19197::"none","pseudoephedrine"}   ROS      Objective:    There were no vitals taken for this visit. {Vitals History (Optional):23777}  Physical Exam  No results found for any visits on 12/05/22.      Assessment & Plan:   Problem List Items Addressed This Visit   None   No orders of the defined types were placed in this encounter.   No follow-ups on file.  Margarita Mail, DO

## 2022-12-05 ENCOUNTER — Ambulatory Visit: Payer: Self-pay | Admitting: Internal Medicine

## 2022-12-07 ENCOUNTER — Ambulatory Visit: Payer: Medicaid Other | Admitting: Internal Medicine

## 2022-12-07 ENCOUNTER — Encounter: Payer: Self-pay | Admitting: Internal Medicine

## 2022-12-07 VITALS — BP 116/68 | HR 103 | Temp 98.2°F | Resp 16 | Ht 66.0 in | Wt 160.9 lb

## 2022-12-07 DIAGNOSIS — S00412A Abrasion of left ear, initial encounter: Secondary | ICD-10-CM

## 2022-12-07 DIAGNOSIS — Z111 Encounter for screening for respiratory tuberculosis: Secondary | ICD-10-CM

## 2022-12-07 DIAGNOSIS — Z0289 Encounter for other administrative examinations: Secondary | ICD-10-CM | POA: Diagnosis not present

## 2022-12-07 NOTE — Progress Notes (Signed)
   Acute Office Visit  Subjective:     Patient ID: Jessica Gates, female    DOB: 1995/07/25, 27 y.o.   MRN: [id number]  Chief Complaint  Patient presents with   paperwork    HPI Patient is in today for forms for work. 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 She also needs TB screening. Overall she is doing and had no other questions or concerns today other than some left ear pain recently. Pain started a few weeks ago with brown/red drainage from the ear and pain on the outside of her ear. No fevers, URI symptoms. No hearing changes or tinnitus.   Review of Systems  All other systems reviewed and are negative.     Objective:    BP 116/68   Pulse (!) 103   Temp 98.2 F (36.8 C)   Resp 16   Ht 5\' 6"  (1.676 m)   Wt 160 lb 14.4 oz (73 kg)   SpO2 98%   BMI 25.97 kg/m  BP Readings from Last 3 Encounters:  12/07/22 116/68  09/17/22 110/72  08/22/22 100/70   Wt Readings from Last 3 Encounters:  12/07/22 160 lb 14.4 oz (73 kg)  09/17/22 147 lb 4.8 oz (66.8 kg)  08/22/22 147 lb (66.7 kg)      Physical Exam Constitutional:      Appearance: Normal appearance.  HENT:     Head: Normocephalic and atraumatic.     Right Ear: Tympanic membrane, ear canal and external ear normal.     Left Ear: Tympanic membrane, ear canal and external ear normal.     Ears:     Comments: Small abrasion in intratragal notch on the left Eyes:     Conjunctiva/sclera: Conjunctivae normal.  Cardiovascular:     Rate and Rhythm: Normal rate and regular rhythm.  Pulmonary:     Effort: Pulmonary effort is normal.     Breath sounds: Normal breath sounds.  Skin:    General: Skin is warm and dry.  Neurological:     General: No focal deficit present.     Mental Status: She is alert. Mental status is at baseline.  Psychiatric:        Mood and Affect: Mood normal.        Behavior: Behavior normal.     No results found for any visits on 12/07/22.      Assessment &  Plan:   1. Encounter for completion of form with patient/Screening-pulmonary TB: TB screening today, forms completed and will be returned to the patient once testing results.   - QuantiFERON-TB Gold Plus  2. Abrasion of left ear, initial encounter: Small abrasion, not infected. Keep clean with peroxide and will follow up if not healing or appears infected.    Return if symptoms worsen or fail to improve.  14/08/23, DO

## 2022-12-09 LAB — QUANTIFERON-TB GOLD PLUS
Mitogen-NIL: 9.58 IU/mL
NIL: 0.03 IU/mL
QuantiFERON-TB Gold Plus: NEGATIVE
TB1-NIL: 0 IU/mL
TB2-NIL: 0.01 IU/mL

## 2022-12-20 IMAGING — CT CT ABD-PELV W/ CM
2 of 4 series · 15 of 46 positions shown, 17 images · IV contrast (APPLIED)
Comparison: None.

CLINICAL DATA: Right lower quadrant pain.

EXAM:
CT ABDOMEN AND PELVIS WITH CONTRAST
TECHNIQUE: Multidetector CT imaging of the abdomen and pelvis was performed
using the standard protocol following bolus administration of
intravenous contrast.

[Series 2: routine abd/pel with · axial · 0.72mm/px · z∈[-1065,-595]mm · 12 of 104 slices shown, 14 images]
[im 5/104  soft-tissue]
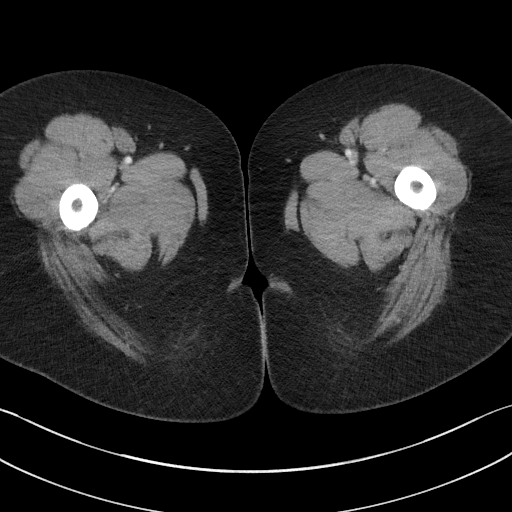
[im 5/104  bone]
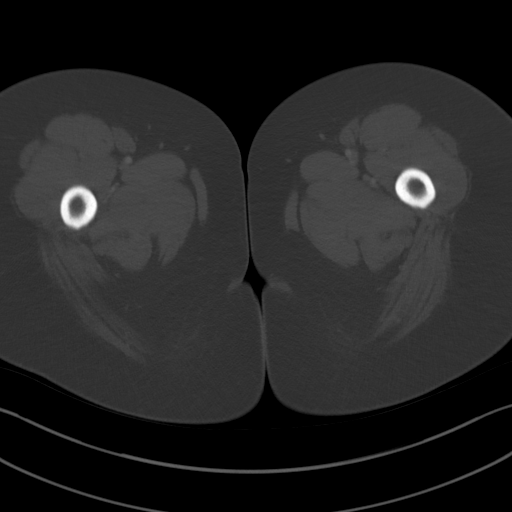
[im 13/104  soft-tissue]
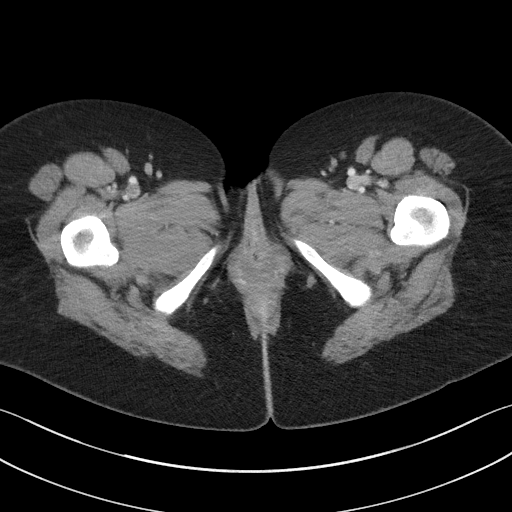
[im 22/104  soft-tissue]
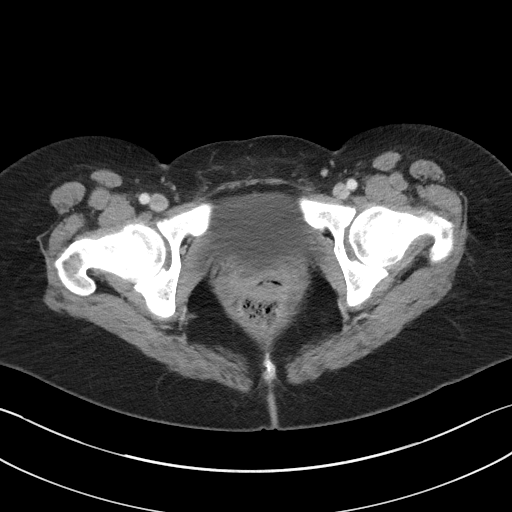
[im 31/104  soft-tissue]
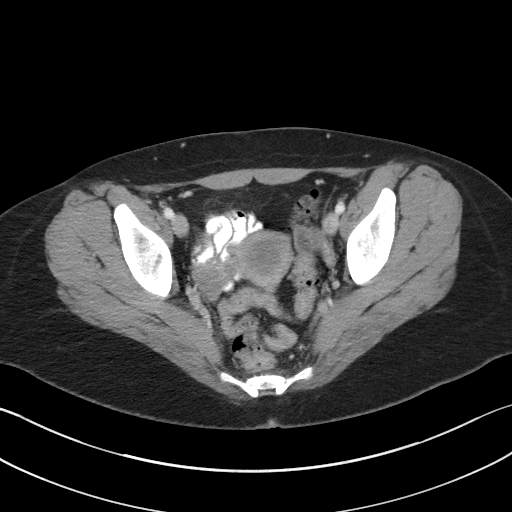
[im 39/104  soft-tissue]
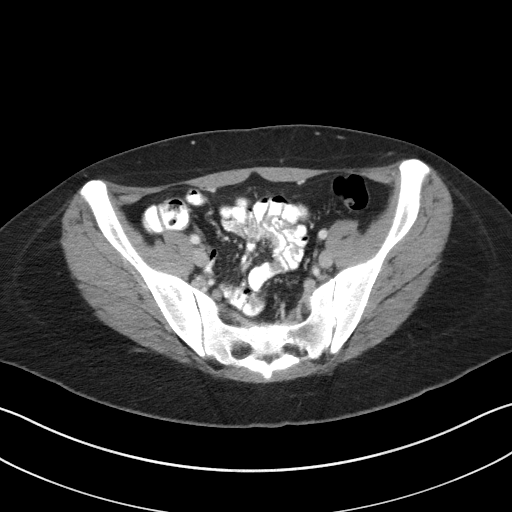
[im 48/104  soft-tissue]
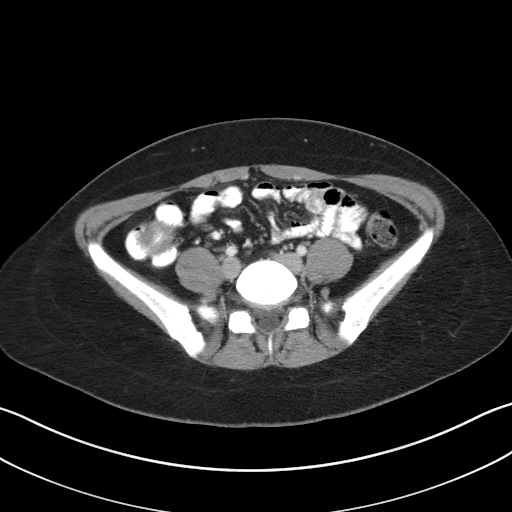
[im 56/104  soft-tissue]
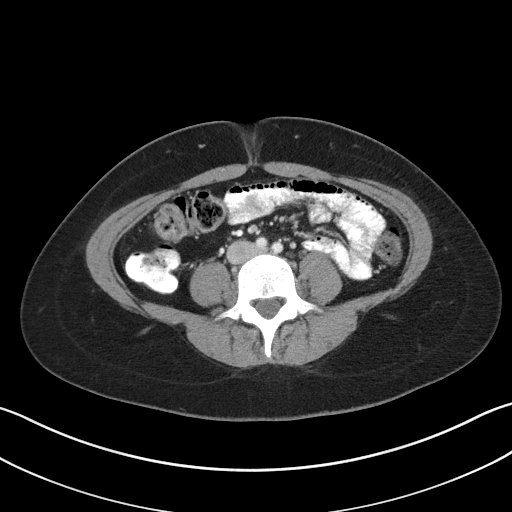
[im 65/104  soft-tissue]
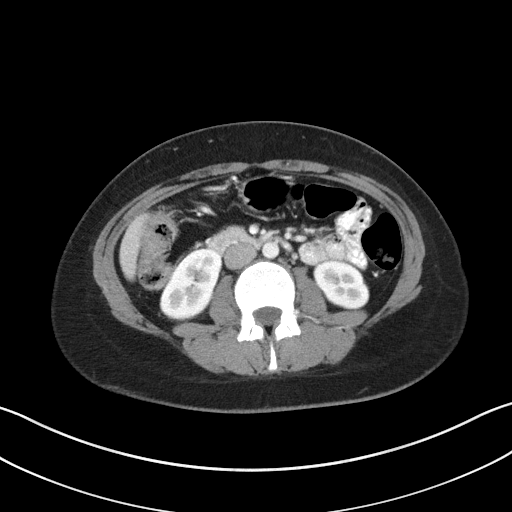
[im 73/104  soft-tissue]
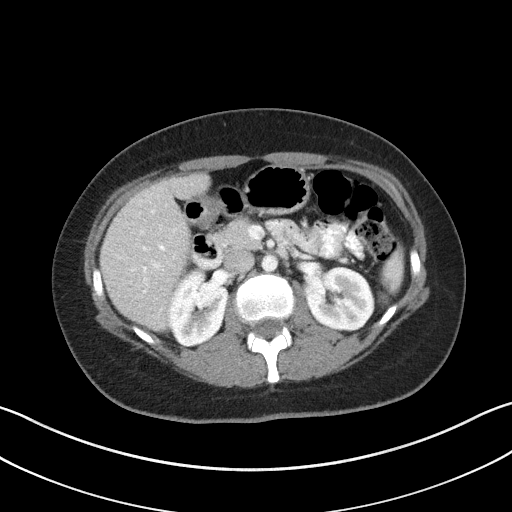
[im 73/104  bone]
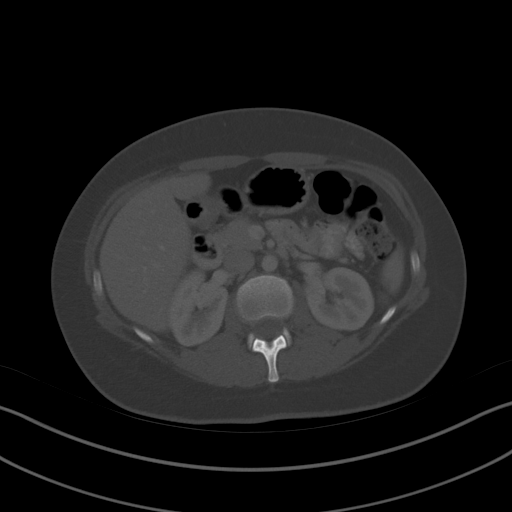
[im 82/104  soft-tissue]
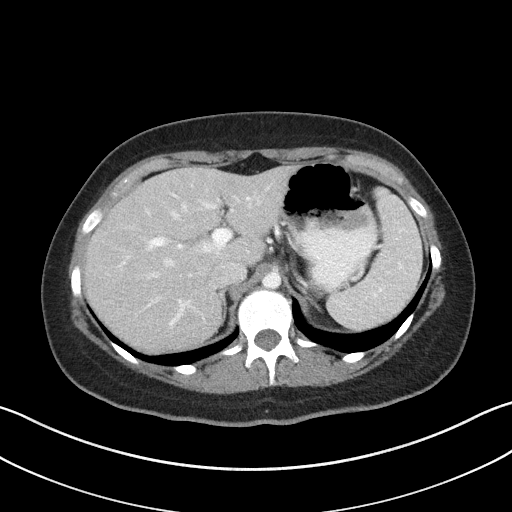
[im 91/104  soft-tissue]
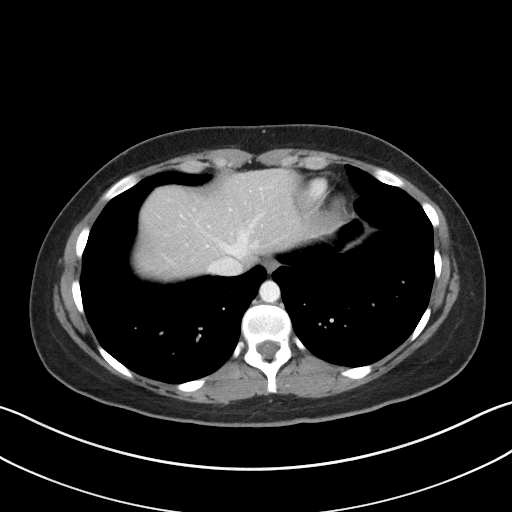
[im 99/104  soft-tissue]
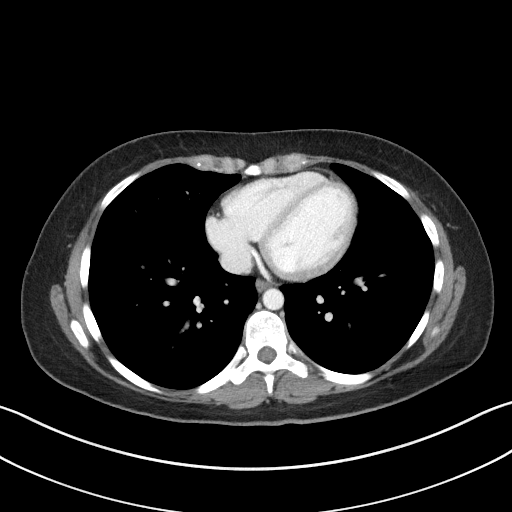

[Series 5: coronal st · coronal · 0.62mm/px · 3 of 79 slices shown]
[im 27/79  soft-tissue]
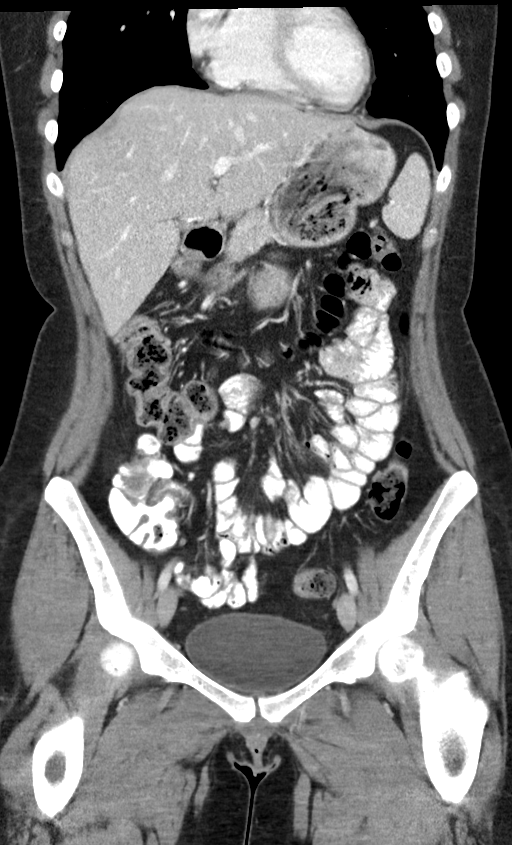
[im 35/79  soft-tissue]
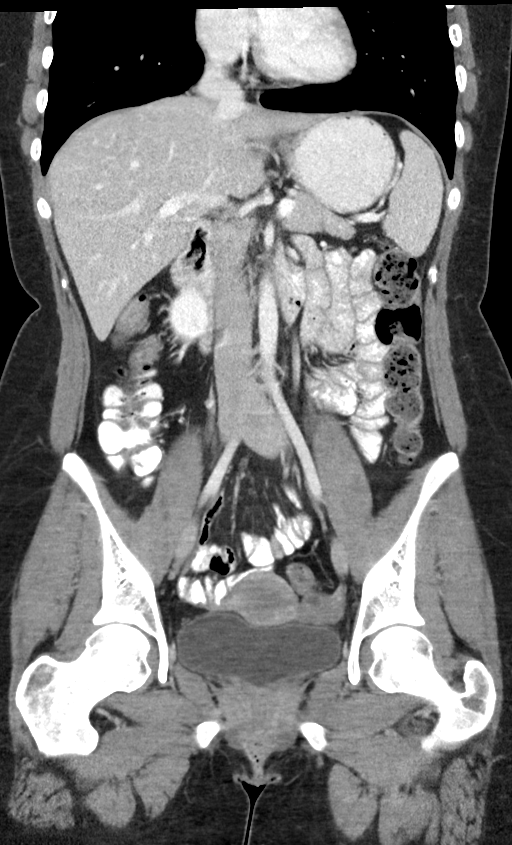
[im 44/79  soft-tissue]
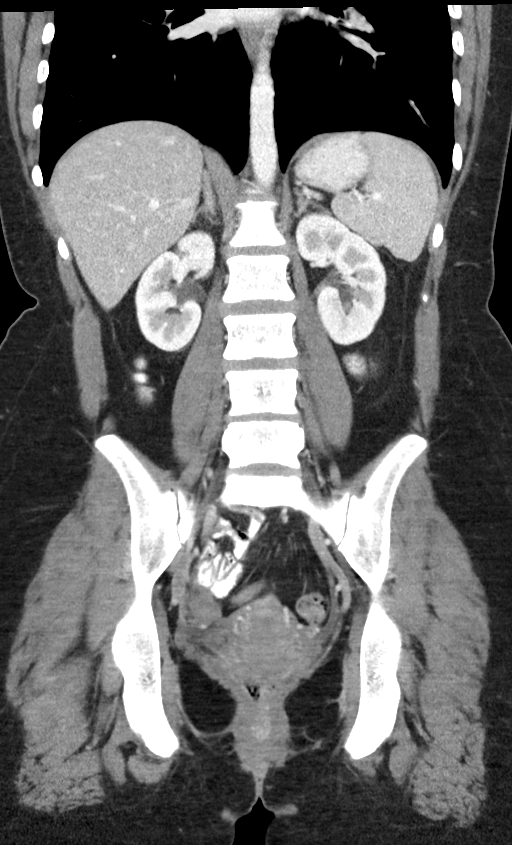

[15 of 46 positions shown; findings below may reference images not displayed]

RADIATION DOSE REDUCTION: This exam was performed according to the
departmental dose-optimization program which includes automated
exposure control, adjustment of the mA and/or kV according to
patient size and/or use of iterative reconstruction technique.

CONTRAST:  100mL OMNIPAQUE IOHEXOL 300 MG/ML  SOLN
FINDINGS: Lower chest: No acute abnormality.

Hepatobiliary: No focal liver abnormality is seen. Status post
cholecystectomy. No biliary dilatation.

Pancreas: Unremarkable. No pancreatic ductal dilatation or
surrounding inflammatory changes.

Spleen: Normal in size without focal abnormality.

Adrenals/Urinary Tract: Adrenal glands are unremarkable. Kidneys are
normal, without renal calculi, focal lesion, or hydronephrosis.
Bladder is unremarkable.

Stomach/Bowel: Stomach is within normal limits. Appendix appears
normal. No evidence of bowel wall thickening, distention, or
inflammatory changes.

Vascular/Lymphatic: No significant vascular findings are present. No
enlarged abdominal or pelvic lymph nodes.

Reproductive: Uterus and bilateral adnexa are unremarkable.

Other: No abdominal wall hernia or abnormality. No abdominopelvic
ascites.

Musculoskeletal: No acute or significant osseous findings.
IMPRESSION: 1. No acute or active process within the abdomen or pelvis.
2. Status post cholecystectomy.

## 2023-01-09 DIAGNOSIS — K219 Gastro-esophageal reflux disease without esophagitis: Secondary | ICD-10-CM | POA: Diagnosis not present

## 2023-01-09 DIAGNOSIS — R1013 Epigastric pain: Secondary | ICD-10-CM | POA: Diagnosis not present

## 2023-01-16 NOTE — Progress Notes (Deleted)
   Acute Office Visit  Subjective:     Patient ID: Jessica Gates, female    DOB: 24-Nov-1995, 28 y.o.   MRN: [id number]  No chief complaint on file.   HPI Patient is in today for bumps on scalp.  RASH Duration:  {Blank single:19197::"chronic","days","weeks","months"}  Location: {Blank multiple:19196::"generalized","trunk","face","hands","arms","legs","groin"}  Itching: {Blank single:19197::"yes","no"} Burning: {Blank single:19197::"yes","no"} Redness: {Blank single:19197::"yes","no"} Oozing: {Blank single:19197::"yes","no"} Scaling: {Blank single:19197::"yes","no"} Blisters: {Blank single:19197::"yes","no"} Painful: {Blank single:19197::"yes","no"} Fevers: {Blank single:19197::"yes","no"} Change in detergents/soaps/personal care products: {Blank single:19197::"yes","no"} Recent illness: {Blank single:19197::"yes","no"} Recent travel:{Blank single:19197::"yes","no"} History of same: {Blank single:19197::"yes","no"} Context: {Blank multiple:19196::"better","worse","stable","fluctuating","contacts with the same"} Alleviating factors: {Blank multiple:19196::"hydrocortisone cream","benadryl","lotion/moisturizer","nothing"} Treatments attempted:{Blank multiple:19196::"hydrocortisone cream","benadryl","OTC anit-fungal","lotion/moisturizer","nothing"} Shortness of breath: {Blank single:19197::"yes","no"}  Throat/tongue swelling: {Blank single:19197::"yes","no"} Myalgias/arthralgias: {Blank single:19197::"yes","no"}   ROS      Objective:    There were no vitals taken for this visit. {Vitals History (Optional):23777}  Physical Exam  No results found for any visits on 01/17/23.      Assessment & Plan:   Problem List Items Addressed This Visit   None   No orders of the defined types were placed in this encounter.   No follow-ups on file.  Teodora Medici, DO

## 2023-01-17 ENCOUNTER — Ambulatory Visit: Payer: Self-pay | Admitting: Internal Medicine

## 2023-01-23 NOTE — Progress Notes (Deleted)
   Acute Office Visit  Subjective:     Patient ID: Jessica Gates, female    DOB: [birth year], 28 y.o.   MRN: [id number]  No chief complaint on file.   HPI Patient is in today for bumps on scalp.  RASH Duration:  {Blank single:19197::"chronic","days","weeks","months"}  Location: {Blank multiple:19196::"generalized","trunk","face","hands","arms","legs","groin"}  Itching: {Blank single:19197::"yes","no"} Burning: {Blank single:19197::"yes","no"} Redness: {Blank single:19197::"yes","no"} Oozing: {Blank single:19197::"yes","no"} Scaling: {Blank single:19197::"yes","no"} Blisters: {Blank single:19197::"yes","no"} Painful: {Blank single:19197::"yes","no"} Fevers: {Blank single:19197::"yes","no"} Change in detergents/soaps/personal care products: {Blank single:19197::"yes","no"} Recent illness: {Blank single:19197::"yes","no"} Recent travel:{Blank single:19197::"yes","no"} History of same: {Blank single:19197::"yes","no"} Context: {Blank multiple:19196::"better","worse","stable","fluctuating","contacts with the same"} Alleviating factors: {Blank multiple:19196::"hydrocortisone cream","benadryl","lotion/moisturizer","nothing"} Treatments attempted:{Blank multiple:19196::"hydrocortisone cream","benadryl","OTC anit-fungal","lotion/moisturizer","nothing"} Shortness of breath: {Blank single:19197::"yes","no"}  Throat/tongue swelling: {Blank single:19197::"yes","no"} Myalgias/arthralgias: {Blank single:19197::"yes","no"}   ROS      Objective:    There were no vitals taken for this visit. {Vitals History (Optional):23777}  Physical Exam  No results found for any visits on 01/24/23.      Assessment & Plan:   Problem List Items Addressed This Visit   None   No orders of the defined types were placed in this encounter.   No follow-ups on file.  Teodora Medici, DO

## 2023-01-24 ENCOUNTER — Ambulatory Visit: Payer: Self-pay | Admitting: Internal Medicine

## 2023-02-18 NOTE — Telephone Encounter (Signed)
Spoke with pt and informed her that [employer] is out of network. She stated that she has already spoken with the billing department and they told her that she should receive a refund for the money that she has paid. I suggested that she give it a few days to see what happens cause it looks as if it is pending insurance. She verbalized understanding.

## 2023-02-18 NOTE — Telephone Encounter (Signed)
Copied from [facility] [phone]. Topic: General - Other >> Feb 18, 2023 10:43 AM Charlotte Sanes J wrote: Reason for CRM: DOS 12.8.23/ pt was seen for an office visit / Pt received bill of $16.50/ pt stated she paid $50 at time of appt / pt has [employer] health medicaid/ she wanted to know why she received the bill and is reaching out to billing as well / please advise

## 2023-03-28 NOTE — Progress Notes (Signed)
PCP:  Teodora Medici, DO   Chief Complaint  Patient presents with   Gynecologic Exam    No concerns     HPI:      Ms. Jessica Gates is a 28 y.o. G0P0000 whose LMP was Patient's last menstrual period was 03/24/2023 (exact date)., presents today for her annual examination.  Her menses are regular every 28-30 days, lasting 3-4 days, mod flow, no BTB, mild dysmen, improved with NSAIDs. Had 2 menses last month but were at beginning and end of month. Also under increased stress due to loss of grandmother.   Sex activity: not currently sexually active. Hadn't conceived in over 6 yrs, wonders about fertility. One partner with children in past. Pt had pos urine ovulation pred kit.  Last Pap: few yrs ago per pt (can't see in Epic); no hx of abn paps. Hx of STDs: none  There is no FH of breast cancer. There is no FH of ovarian cancer. The patient does not do self-breast exams.  Tobacco use: vapes daily Alcohol use: none No drug use.  Exercise: not active  She does get adequate calcium but not Vitamin D in her diet.  Patient Active Problem List   Diagnosis Date Noted   Bipolar I disorder 02/01/2022   ADHD 10/10/2020   Anxiety and depression 10/10/2020    Past Surgical History:  Procedure Laterality Date   CHOLECYSTECTOMY N/A 02/22/2016   Procedure: LAPAROSCOPIC CHOLECYSTECTOMY WITH INTRAOPERATIVE CHOLANGIOGRAM;  Surgeon: Florene Glen, MD;  Location: [facility] ORS;  Service: General;  Laterality: N/A;   CHOLECYSTECTOMY  02/2016    Family History  Problem Relation Age of Onset   Cholelithiasis Mother    Cancer Mother 84       kidney   Mental illness Mother    Appendicitis Father    Cancer Maternal Grandfather        lung and throat   Diabetes Paternal Grandmother     Social History   Socioeconomic History   Marital status: Single    Spouse name: Not on file   Number of children: Not on file   Years of education: Not on file   Highest education level: Not on  file  Occupational History   Not on file  Tobacco Use   Smoking status: Never   Smokeless tobacco: Never  Vaping Use   Vaping Use: Some days   Substances: Nicotine, Flavoring  Substance and Sexual Activity   Alcohol use: Never    Comment: occ   Drug use: Yes    Types: Marijuana    Comment: percocet   Sexual activity: Not Currently    Birth control/protection: None  Other Topics Concern   Not on file  Social History Narrative   ** Merged History Encounter **       Social Determinants of Health   Financial Resource Strain: Not on file  Food Insecurity: Not on file  Transportation Needs: Not on file  Physical Activity: Not on file  Stress: Not on file  Social Connections: Not on file  Intimate Partner Violence: Not on file     Current Outpatient Medications:    ALPRAZolam (XANAX) 0.25 MG tablet, Take 0.125-0.25 mg by mouth daily as needed., Disp: , Rfl:    amphetamine-dextroamphetamine (ADDERALL) 20 MG tablet, Take by mouth., Disp: , Rfl:    ARIPiprazole (ABILIFY) 10 MG tablet, Take 10 mg by mouth daily., Disp: , Rfl:    buPROPion (WELLBUTRIN XL) 300 MG 24 hr tablet, Take 300  mg by mouth every morning, Disp: , Rfl:    omeprazole (PRILOSEC) 20 MG capsule, Take 20 mg by mouth daily., Disp: , Rfl:    QUEtiapine (SEROQUEL) 100 MG tablet, Take 100-200 mg by mouth at bedtime., Disp: , Rfl:      ROS:  Review of Systems  Constitutional:  Negative for fatigue, fever and unexpected weight change.  Respiratory:  Negative for cough, shortness of breath and wheezing.   Cardiovascular:  Negative for chest pain, palpitations and leg swelling.  Gastrointestinal:  Negative for blood in stool, constipation, diarrhea, nausea and vomiting.  Endocrine: Negative for cold intolerance, heat intolerance and polyuria.  Genitourinary:  Negative for dyspareunia, dysuria, flank pain, frequency, genital sores, hematuria, menstrual problem, pelvic pain, urgency, vaginal bleeding, vaginal  discharge and vaginal pain.  Musculoskeletal:  Negative for back pain, joint swelling and myalgias.  Skin:  Negative for rash.  Neurological:  Negative for dizziness, syncope, light-headedness, numbness and headaches.  Hematological:  Negative for adenopathy.  Psychiatric/Behavioral:  Positive for agitation and dysphoric mood. Negative for confusion, sleep disturbance and suicidal ideas. The patient is not nervous/anxious.    BREAST: No symptoms   Objective: BP 114/70   Ht 5\' 6"  (1.676 m)   Wt 189 lb (85.7 kg)   LMP 03/24/2023 (Exact Date)   BMI 30.51 kg/m    Physical Exam Constitutional:      Appearance: She is well-developed.  Genitourinary:     Vulva normal.     Right Labia: No rash, tenderness or lesions.    Left Labia: No tenderness, lesions or rash.    No vaginal discharge, erythema or tenderness.      Right Adnexa: not tender and no mass present.    Left Adnexa: not tender and no mass present.    No cervical friability or polyp.     Uterus is not enlarged or tender.  Breasts:    Right: No mass, nipple discharge, skin change or tenderness.     Left: No mass, nipple discharge, skin change or tenderness.  Neck:     Thyroid: No thyromegaly.  Cardiovascular:     Rate and Rhythm: Normal rate and regular rhythm.     Heart sounds: Normal heart sounds. No murmur heard. Pulmonary:     Effort: Pulmonary effort is normal.     Breath sounds: Normal breath sounds.  Abdominal:     Palpations: Abdomen is soft.     Tenderness: There is no abdominal tenderness. There is no guarding or rebound.  Musculoskeletal:        General: Normal range of motion.     Cervical back: Normal range of motion.  Lymphadenopathy:     Cervical: No cervical adenopathy.  Neurological:     General: No focal deficit present.     Mental Status: She is alert and oriented to person, place, and time.     Cranial Nerves: No cranial nerve deficit.  Skin:    General: Skin is warm and dry.  Psychiatric:         Mood and Affect: Mood normal.        Behavior: Behavior normal.        Thought Content: Thought content normal.        Judgment: Judgment normal.  Vitals reviewed.     Assessment/Plan: Encounter for annual routine gynecological examination  Cervical cancer screening - Plan: Cytology - PAP  Infertility counseling--pt f/u after trying to conceive once has new sexual partner. Discussed eval/mgmt.  GYN counsel adequate intake of calcium and vitamin D, diet and exercise; d/c vaping     F/U  Return in about 1 year (around 03/31/2024).  Jessica Shimkus B. Latrina Guttman, PA-C 04/01/2023 10:35 AM

## 2023-04-01 ENCOUNTER — Encounter: Payer: Self-pay | Admitting: Obstetrics and Gynecology

## 2023-04-01 ENCOUNTER — Ambulatory Visit (INDEPENDENT_AMBULATORY_CARE_PROVIDER_SITE_OTHER): Payer: Medicaid Other | Admitting: Obstetrics and Gynecology

## 2023-04-01 ENCOUNTER — Other Ambulatory Visit (HOSPITAL_COMMUNITY)
Admission: RE | Admit: 2023-04-01 | Discharge: 2023-04-01 | Disposition: A | Discharge status: Home / Self Care | Payer: BLUE CROSS/BLUE SHIELD | Source: Ambulatory Visit | Attending: Obstetrics and Gynecology | Admitting: Obstetrics and Gynecology

## 2023-04-01 VITALS — BP 114/70 | Ht 66.0 in | Wt 189.0 lb

## 2023-04-01 DIAGNOSIS — Z3169 Encounter for other general counseling and advice on procreation: Secondary | ICD-10-CM

## 2023-04-01 DIAGNOSIS — Z124 Encounter for screening for malignant neoplasm of cervix: Secondary | ICD-10-CM

## 2023-04-01 DIAGNOSIS — Z01419 Encounter for gynecological examination (general) (routine) without abnormal findings: Secondary | ICD-10-CM | POA: Diagnosis not present

## 2023-04-01 NOTE — Patient Instructions (Signed)
I value your feedback and you entrusting us with your care. If you get a [facility] patient survey, I would appreciate you taking the time to let us know about your experience today. Thank you! ? ? ?

## 2023-04-02 ENCOUNTER — Encounter: Payer: Self-pay | Admitting: Obstetrics and Gynecology

## 2023-04-02 LAB — CYTOLOGY - PAP
Adequacy: ABSENT
Diagnosis: NEGATIVE

## 2023-06-11 ENCOUNTER — Encounter: Payer: Self-pay | Admitting: Family Medicine

## 2023-06-11 ENCOUNTER — Other Ambulatory Visit: Payer: Self-pay

## 2023-06-11 ENCOUNTER — Ambulatory Visit (INDEPENDENT_AMBULATORY_CARE_PROVIDER_SITE_OTHER): Payer: Medicaid Other | Admitting: Family Medicine

## 2023-06-11 VITALS — BP 122/74 | HR 98 | Temp 98.1°F | Resp 16 | Ht 66.0 in | Wt 188.5 lb

## 2023-06-11 DIAGNOSIS — R300 Dysuria: Secondary | ICD-10-CM | POA: Diagnosis not present

## 2023-06-11 DIAGNOSIS — R8290 Unspecified abnormal findings in urine: Secondary | ICD-10-CM

## 2023-06-11 DIAGNOSIS — N3001 Acute cystitis with hematuria: Secondary | ICD-10-CM

## 2023-06-11 DIAGNOSIS — M545 Low back pain, unspecified: Secondary | ICD-10-CM

## 2023-06-11 LAB — POCT URINALYSIS DIPSTICK
Bilirubin, UA: NEGATIVE
Glucose, UA: NEGATIVE
Ketones, UA: NEGATIVE
Nitrite, UA: POSITIVE
Protein, UA: POSITIVE — AB
Spec Grav, UA: 1.02 (ref 1.010–1.025)
Urobilinogen, UA: 0.2 E.U./dL
pH, UA: 6.5 (ref 5.0–8.0)

## 2023-06-11 MED ORDER — CEPHALEXIN 500 MG PO CAPS
500.0000 mg | ORAL_CAPSULE | Freq: Two times a day (BID) | ORAL | 0 refills | Status: AC
Start: 2023-06-11 — End: 2023-06-16

## 2023-06-11 NOTE — Progress Notes (Signed)
Patient ID: LATASH BRAGAN, female    DOB: [birth year], 28 y.o.   MRN: [id number]  PCP: Margarita Mail, DO  Chief Complaint  Patient presents with   Dysuria    Pain when urinating,low back pain for 3 days    Subjective:   VALENTINA DEVEY is a 28 y.o. female, presents to clinic with CC of the following:  HPI  Right flank pain/back pain- she noticed this a few days ago and also started to have Odor to urine, darker color - cloudy No hematuria, dysuria, urinary frequency, no vaginal sx Her mom had some kind of cancerous mass in kidney so she wanted to get it checked The pain in her back is low and right sided w/o radiation, no meds tried for it, no hx of low back pain or injury  She denies any new sexual partners or exposure to STDs, no vaginal symptoms, nausea, vomiting fever sweats or chills  Results for orders placed or performed in visit on 06/11/23  POCT urinalysis dipstick  Result Value Ref Range   Color, UA gold    Clarity, UA cloudy    Glucose, UA Negative Negative   Bilirubin, UA negative    Ketones, UA negative    Spec Grav, UA 1.020 1.010 - 1.025   Blood, UA large    pH, UA 6.5 5.0 - 8.0   Protein, UA Positive (A) Negative   Urobilinogen, UA 0.2 0.2 or 1.0 E.U./dL   Nitrite, UA positive    Leukocytes, UA Large (3+) (A) Negative   Appearance cloudy    Odor strong         Patient Active Problem List   Diagnosis Date Noted   Bipolar I disorder (HCC) 02/01/2022   ADHD 10/10/2020   Anxiety and depression 10/10/2020      Current Outpatient Medications:    ALPRAZolam (XANAX) 0.25 MG tablet, Take 0.125-0.25 mg by mouth daily as needed., Disp: , Rfl:    amphetamine-dextroamphetamine (ADDERALL) 20 MG tablet, Take by mouth., Disp: , Rfl:    ARIPiprazole (ABILIFY) 10 MG tablet, Take 10 mg by mouth daily., Disp: , Rfl:    buPROPion (WELLBUTRIN XL) 300 MG 24 hr tablet, Take 300 mg by mouth every morning, Disp: , Rfl:    omeprazole (PRILOSEC) 20 MG  capsule, Take 20 mg by mouth daily., Disp: , Rfl:    QUEtiapine (SEROQUEL) 100 MG tablet, Take 100-200 mg by mouth at bedtime., Disp: , Rfl:    No Known Allergies   Social History   Tobacco Use   Smoking status: Never   Smokeless tobacco: Never  Vaping Use   Vaping Use: Some days   Substances: Nicotine, Flavoring  Substance Use Topics   Alcohol use: Never    Comment: occ   Drug use: Yes    Types: Marijuana    Comment: percocet      Chart Review Today: I personally reviewed active problem list, medication list, allergies, family history, social history, health maintenance, notes from last encounter, lab results, imaging with the patient/caregiver today.   Review of Systems  Constitutional: Negative.   HENT: Negative.    Eyes: Negative.   Respiratory: Negative.    Cardiovascular: Negative.   Gastrointestinal: Negative.   Endocrine: Negative.   Genitourinary: Negative.   Musculoskeletal: Negative.   Skin: Negative.   Allergic/Immunologic: Negative.   Neurological: Negative.   Hematological: Negative.   Psychiatric/Behavioral: Negative.    All other systems reviewed and are negative.  Objective:   Vitals:   06/11/23 1125  BP: 122/74  Pulse: 98  Resp: 16  Temp: 98.1 F (36.7 C)  TempSrc: Oral  SpO2: 98%  Weight: 188 lb 8 oz (85.5 kg)  Height: 5\' 6"  (1.676 m)    Body mass index is 30.42 kg/m.  Physical Exam Vitals and nursing note reviewed.  Constitutional:      General: She is not in acute distress.    Appearance: Normal appearance. She is well-developed. She is not ill-appearing, toxic-appearing or diaphoretic.  HENT:     Head: Normocephalic and atraumatic.     Nose: Nose normal.  Eyes:     General:        Right eye: No discharge.        Left eye: No discharge.     Conjunctiva/sclera: Conjunctivae normal.  Neck:     Trachea: No tracheal deviation.  Cardiovascular:     Rate and Rhythm: Normal rate and regular rhythm.     Pulses: Normal  pulses.     Heart sounds: Normal heart sounds. No murmur heard.    No friction rub. No gallop.  Pulmonary:     Effort: Pulmonary effort is normal. No respiratory distress.     Breath sounds: Normal breath sounds. No stridor.  Abdominal:     General: There is no distension.     Palpations: Abdomen is soft.     Tenderness: There is no abdominal tenderness. There is no right CVA tenderness, left CVA tenderness, guarding or rebound.  Musculoskeletal:     Comments: No midline tenderness from cervical to lumbar spine, no step off No paraspinal muscle ttp from cervical to thoracic spine Right LUMBAR paraspinal muscle ttp  Grossly normal ROM of back Normal gait  Skin:    General: Skin is warm and dry.     Findings: No rash.  Neurological:     Mental Status: She is alert.     Motor: No abnormal muscle tone.     Coordination: Coordination normal.  Psychiatric:        Behavior: Behavior normal.      Results for orders placed or performed in visit on 06/11/23  POCT urinalysis dipstick  Result Value Ref Range   Color, UA gold    Clarity, UA cloudy    Glucose, UA Negative Negative   Bilirubin, UA negative    Ketones, UA negative    Spec Grav, UA 1.020 1.010 - 1.025   Blood, UA large    pH, UA 6.5 5.0 - 8.0   Protein, UA Positive (A) Negative   Urobilinogen, UA 0.2 0.2 or 1.0 E.U./dL   Nitrite, UA positive    Leukocytes, UA Large (3+) (A) Negative   Appearance cloudy    Odor strong        Assessment & Plan:   1. Abnormal urine odor Darker urine with odor, UA is significant for +blood +leuk and +nitrites concerning for UTI - POCT urinalysis dipstick  2. Acute cystitis with hematuria No sig abd ttp, no CVA tenderness - dip concerning for UTI so will start empiric tx She came in for back pain but with benign exam and stable vital signs I am not currently concerned for pyelonephritis or complicated UTI (see below) - POCT urinalysis dipstick - Urine Culture - cephALEXin  (KEFLEX) 500 MG capsule; Take 1 capsule (500 mg total) by mouth 2 (two) times daily for 5 days.  Dispense: 10 capsule; Refill: 0  3. Acute right-sided low back pain  without sciatica There was no CVA tenderness bilaterally but she did have some right lumbar paraspinal muscle tenderness to palpation which was reproducible -and her pain may be muscle skeletal Did encourage her to use over-the-counter medications and monitor the pain to see if it improves with treatment with antibiotics and follow-up if not gradually improving Currently normal ROM, no radiation of pain and no injury - so OTC tx and conservative measures should be adequate and she was encouraged to f/up if sx do not resolve in the next couple weeks      Danelle Berry, PA-C 06/11/23 11:44 AM

## 2023-06-13 LAB — URINE CULTURE
MICRO NUMBER:: 15069723
SPECIMEN QUALITY:: ADEQUATE

## 2023-07-11 ENCOUNTER — Ambulatory Visit: Payer: Medicaid Other | Admitting: Nurse Practitioner

## 2023-08-14 ENCOUNTER — Other Ambulatory Visit: Payer: Self-pay | Admitting: Internal Medicine

## 2023-08-14 ENCOUNTER — Telehealth: Payer: Self-pay | Admitting: Internal Medicine

## 2023-08-14 DIAGNOSIS — L700 Acne vulgaris: Secondary | ICD-10-CM

## 2023-08-14 NOTE — Telephone Encounter (Signed)
Copied from CRM [id number]. Topic: Referral - Request for Referral >> Aug 14, 2023 11:28 AM De Blanch wrote: Has patient seen PCP for this complaint? No. *If NO, is insurance requiring patient see PCP for this issue before PCP can refer them? Referral for which specialty:  dermatologist Preferred provider/office: [facility] Reason for referral:Pt stated lately she has been breaking out a lot on the upper jaw and neck with acne and would like to see a dermatologist; however, they are requesting a referral. Pt asked if a referral can be sent to [facility] for acne.  Please advice.

## 2023-10-22 ENCOUNTER — Ambulatory Visit: Payer: BLUE CROSS/BLUE SHIELD | Admitting: Dermatology

## 2023-11-27 ENCOUNTER — Telehealth: Payer: Self-pay | Admitting: Internal Medicine

## 2023-11-27 NOTE — Telephone Encounter (Signed)
We have not submitted any forms on her and have not seen her in office since 06/11/23.

## 2023-11-27 NOTE — Telephone Encounter (Signed)
Copied from CRM [id number]. Topic: General - Other >> Nov 27, 2023 11:02 AM Franchot Heidelberg wrote: Reason for CRM: Onalee Hua from Disability determination services called to report that they are missing records from the recent fax they submitted. Please advise   Best contact: [phone]  Make sure the barcode is on top of whatever is submitted.

## 2023-12-13 ENCOUNTER — Ambulatory Visit: Payer: Self-pay

## 2023-12-13 NOTE — Telephone Encounter (Signed)
Summary: possible sinus/ear infection   Patient is experiencing nasal pressure and when she blow it is clear. She also has pain in both ears. No appts available today at practice nor [facility]. Please f/u with patient       Called pt - Call cannot be completed at this time. Unable to contact pt after 3 tries. Will forward encounter to clinic for follow up.

## 2023-12-13 NOTE — Telephone Encounter (Signed)
Summary: possible sinus/ear infection   Patient is experiencing nasal pressure and when she blow it is clear. She also has pain in both ears. No appts available today at practice nor [facility]. Please f/u with patient      Called pt - call cannot be completed at this time.

## 2023-12-13 NOTE — Telephone Encounter (Signed)
Summary: possible sinus/ear infection   Patient is experiencing nasal pressure and when she blow it is clear. She also has pain in both ears. No appts available today at practice nor [facility]. Please f/u with patient      Called pt - Call cannot be completed at this time

## 2023-12-13 NOTE — Telephone Encounter (Signed)
She will either need to schedule 1st available or go to urgent care

## 2023-12-16 ENCOUNTER — Encounter: Payer: Self-pay | Admitting: Nurse Practitioner

## 2023-12-16 ENCOUNTER — Ambulatory Visit (INDEPENDENT_AMBULATORY_CARE_PROVIDER_SITE_OTHER): Payer: Self-pay | Admitting: Nurse Practitioner

## 2023-12-16 VITALS — BP 118/72 | HR 93 | Temp 97.7°F | Resp 14 | Ht 66.0 in | Wt 203.2 lb

## 2023-12-16 DIAGNOSIS — J0140 Acute pansinusitis, unspecified: Secondary | ICD-10-CM

## 2023-12-16 MED ORDER — AMOXICILLIN-POT CLAVULANATE 875-125 MG PO TABS
1.0000 | ORAL_TABLET | Freq: Two times a day (BID) | ORAL | 0 refills | Status: DC
Start: 2023-12-16 — End: 2024-04-13

## 2023-12-16 NOTE — Progress Notes (Signed)
BP 118/72 (BP Location: Left Arm, Patient Position: Sitting, Cuff Size: Large)   Pulse 93   Temp 97.7 F (36.5 C) (Oral)   Resp 14   Ht 5\' 6"  (1.676 m) Comment: per patient  Wt 203 lb 3.2 oz (92.2 kg)   SpO2 98%   BMI 32.80 kg/m    Subjective:    Patient ID: Jessica Gates, female    DOB: February 07, 1995, 28 y.o.   MRN: [id number]  HPI: Jessica Gates is a 28 y.o. female  Chief Complaint  Patient presents with   URI    X2 weeks    Discussed the use of AI scribe software for clinical note transcription with the patient, who gave verbal consent to proceed.  History of Present Illness   The patient, with a history of sinus issues, presents with a two-week history of nasal congestion and a one-week history of bilateral ear fullness and popping. They deny fever, cough, and exposure to sick contacts. They report no headache, but do note discomfort in the nasal area. They have been self-treating with over-the-counter Mucinex and a Mucinex brand nasal spray, but have not used any treatments specifically for their ears.       12/16/2023   11:41 AM 06/11/2023   11:26 AM 12/07/2022    1:32 PM  Depression screen PHQ 2/9  Decreased Interest 0 0 0  Down, Depressed, Hopeless 0 0 0  PHQ - 2 Score 0 0 0  Altered sleeping 0 0 0  Tired, decreased energy 0 0 0  Change in appetite 0 0 0  Feeling bad or failure about yourself  0 0 0  Trouble concentrating 0 0 0  Moving slowly or fidgety/restless 0 0 0  Suicidal thoughts 0 0 0  PHQ-9 Score 0 0 0  Difficult doing work/chores  Not difficult at all Not difficult at all    Relevant past medical, surgical, family and social history reviewed and updated as indicated. Interim medical history since our last visit reviewed. Allergies and medications reviewed and updated.  Review of Systems  Ten systems reviewed and is negative except as mentioned in HPI       Objective:    BP 118/72 (BP Location: Left Arm, Patient Position: Sitting, Cuff  Size: Large)   Pulse 93   Temp 97.7 F (36.5 C) (Oral)   Resp 14   Ht 5\' 6"  (1.676 m) Comment: per patient  Wt 203 lb 3.2 oz (92.2 kg)   SpO2 98%   BMI 32.80 kg/m    Wt Readings from Last 3 Encounters:  12/16/23 203 lb 3.2 oz (92.2 kg)  06/11/23 188 lb 8 oz (85.5 kg)  04/01/23 189 lb (85.7 kg)    Physical Exam  Constitutional: Patient appears well-developed and well-nourished. Obese No distress.  HEENT: head atraumatic, normocephalic, pupils equal and reactive to light, ears TMs clear, neck supple, throat within normal limits, no lymphadenopathy, facial tenderness Cardiovascular: Normal rate, regular rhythm and normal heart sounds.  No murmur heard. No BLE edema. Pulmonary/Chest: Effort normal and breath sounds normal. No respiratory distress. Abdominal: Soft.  There is no tenderness. Psychiatric: Patient has a normal mood and affect. behavior is normal. Judgment and thought content normal.       Assessment & Plan:   Problem List Items Addressed This Visit   None Visit Diagnoses       Acute non-recurrent pansinusitis    -  Primary   Relevant Medications   amoxicillin-clavulanate (AUGMENTIN) 875-125  MG tablet        Assessment and Plan    Sinusitis Nasal congestion and ear fullness for 2 weeks. No fever, cough, or sore throat. Tenderness over maxillary sinuses. Currently using over-the-counter Mucinex and nasal spray. -Prescribe Augmentin for 10 days, twice daily. -Continue Mucinex and fluids. -Consider probiotics if diarrhea develops from antibiotic use. -Consider adding Flonase for inflammation and an allergy pill (Zyrtec or Claritin) for drying effect. -Check in a few days to assess response to treatment.        Follow up plan: Return if symptoms worsen or fail to improve.

## 2024-01-07 NOTE — Telephone Encounter (Signed)
 Alm calling to follow up on records request that was faxed to office In October 2024. Alm states form was received back but no records. Received fax on 10/28/2023 with pages 5-9, Alm unsure what the first 4 pages were, if they were medical records or something stating there were no records available.   Alm will fax request to office for updated records.

## 2024-03-26 ENCOUNTER — Ambulatory Visit: Payer: Self-pay | Admitting: *Deleted

## 2024-03-26 NOTE — Telephone Encounter (Signed)
 Copied from CRM [id number]. Topic: Clinical - Red Word Triage >> Mar 26, 2024  8:56 AM Nyra Capes wrote: Red Word that prompted transfer to Nurse Triage: patient called in. EMS was called Tuesday 03/25 night for seizure, refused to go to the hospital. Muscles twitch and jerking all over body. Due to muscle twitching patient has fallen since seizure happened. Reason for Disposition  [1] New-onset muscle jerks (twitches, spasms) AND [2] present now  Answer Assessment - Initial Assessment Questions 1. APPEARANCE of MOVEMENT: "What did the jerking or twitching look like?" (e.g., body area)     Had seizure Tues. Night.   EMS came out.  I refused to go to the hospital since I don't have insurance.   I didn't want a big bill. They did an EKG and pulse 169.  They said I should go to the hospital.  I've fallen at least 2-3 times over the last 2 days.   I'm tripping over things and I'm falling and I hit my head.   I'm twitching all over my body.     2. ONSET: "When did this start happening?" (e.g., hours, days, weeks, months ago)     Tues night I had a seizure. I've been twitching all over my body since then and falling.   All of this is brand new to me.    3. DURATION: "How long does the jerk, twitch, or spasm last?"     I referred her to the ED.  My Mom can take me to the hospital.     Since Tues.  I've been falling and having muscle twitching all over my body causing me to fall.   No injuries when she hit her head. 4. FREQUENCY:  "How often does this happen?"      Daily since Tues. When had the seizure.  5. WHEN: "When does this happen?" (e.g., while awake, while falling asleep, while sleeping)     Daily 6. CAUSE: "What do you think caused the seizure?"     Never been diagnosed with any seizure disorder.   THis is all new. 7. OTHER SYMPTOMS: "Are there any other symptoms?" (e.g., fever, headache)     Muscle twitching all over her body.   Falling, Tripping over things.    8. PREGNANCY: "Is there any  chance you are pregnant?" "When was your last menstrual period?"     Not asked  Protocols used: Muscle Jerks - Tics - [facility]  Chief Complaint: Muscle twitching all over her body since having a seizure tues. Night.    Symptoms: She is still having muscle twitching all over her body.   Falling, hit her head once but denies injuries, tripping over things.   Has fallen 2-3 times over the last 2 days.   This is all new for her.   Never had a seizure before.   Refused to go to ED Tues. Night when EMS came out when seizure happened.   I don't have insurance so I didn't go.     Frequency: Since Tues night when she had her first seizure. Pertinent Negatives: Patient denies head injury when she hit her head during one of the falls. Disposition: [x] ED /[] Urgent Care (no appt availability in office) / [] Appointment(In office/virtual)/ []  [facility]/ [] Home Care/ [] Refused Recommended Disposition /[] [facility]/ []  Follow-up with PCP Additional Notes: I have referred her to the ED.    I let her know there are financial plans that can be worked out  but her health is more important.   She is agreeable to going.   Her mother is with her and can take her to [facility] now.     Message sent to Dr. Caralee Ates making her aware of the ED referral.

## 2024-04-09 NOTE — Progress Notes (Signed)
 PCP:  Rockney Cid, DO   Chief Complaint  Patient presents with   Gynecologic Exam    No concerns     HPI:      Ms. Jessica Gates is a 29 y.o. G0P0000 whose LMP was Patient's last menstrual period was 03/17/2024 (exact date)., presents today for her annual examination.  Her menses are regular every 28-30 days, lasting 4-5 days, mod flow, no BTB, mild dysmen, no meds needed.   Sex activity: not currently sexually active. Hadn't conceived in over 6 yrs, wonders about fertility. One partner with children in past. Pt had pos urine ovulation pred kit.  Last Pap: 04/01/23 Results were NILM; no hx of abn paps. Hx of STDs: none; declines testing  There is no FH of breast cancer. There is no FH of ovarian cancer. The patient does not do self-breast exams.  Tobacco use: vapes daily Alcohol use: none No drug use.  Exercise: min active  She does get adequate calcium but not Vitamin D in her diet.  Patient Active Problem List   Diagnosis Date Noted   Bipolar I disorder (HCC) 02/01/2022   ADHD 10/10/2020   Anxiety and depression 10/10/2020    Past Surgical History:  Procedure Laterality Date   CHOLECYSTECTOMY N/A 02/22/2016   Procedure: LAPAROSCOPIC CHOLECYSTECTOMY WITH INTRAOPERATIVE CHOLANGIOGRAM;  Surgeon: Claudia Cuff, MD;  Location: [facility] ORS;  Service: General;  Laterality: N/A;   CHOLECYSTECTOMY  02/2016    Family History  Problem Relation Age of Onset   Cholelithiasis Mother    Cancer Mother 61       kidney   Mental illness Mother    Appendicitis Father    Cancer Maternal Grandfather        lung and throat   Diabetes Paternal Grandmother     Social History   Socioeconomic History   Marital status: Single    Spouse name: Not on file   Number of children: Not on file   Years of education: Not on file   Highest education level: Not on file  Occupational History   Not on file  Tobacco Use   Smoking status: Never   Smokeless tobacco: Never  Vaping  Use   Vaping status: Every Day   Substances: Nicotine, Flavoring  Substance and Sexual Activity   Alcohol use: Not Currently    Comment: occ   Drug use: Not Currently    Types: Marijuana    Comment: percocet   Sexual activity: Not Currently    Birth control/protection: None  Other Topics Concern   Not on file  Social History Narrative   ** Merged History Encounter **       Social Drivers of Health   Financial Resource Strain: Low Risk  (12/16/2023)   Overall Financial Resource Strain (CARDIA)    Difficulty of Paying Living Expenses: Not hard at all  Food Insecurity: No Food Insecurity (12/16/2023)   Hunger Vital Sign    Worried About Running Out of Food in the Last Year: Never true    Ran Out of Food in the Last Year: Never true  Transportation Needs: No Transportation Needs (12/16/2023)   PRAPARE - Administrator, Civil Service (Medical): No    Lack of Transportation (Non-Medical): No  Physical Activity: Insufficiently Active (12/16/2023)   Exercise Vital Sign    Days of Exercise per Week: 3 days    Minutes of Exercise per Session: 30 min  Stress: No Stress Concern Present (12/16/2023)  [employer] of Occupational Health - Occupational Stress Questionnaire    Feeling of Stress : Only a little  Social Connections: Moderately Integrated (12/16/2023)   Social Connection and Isolation Panel [NHANES]    Frequency of Communication with Friends and Family: More than three times a week    Frequency of Social Gatherings with Friends and Family: Twice a week    Attends Religious Services: More than 4 times per year    Active Member of [employer] or Organizations: No    Attends Banker Meetings: Never    Marital Status: Married  Catering manager Violence: Not At Risk (12/16/2023)   Humiliation, Afraid, Rape, and Kick questionnaire    Fear of Current or Ex-Partner: No    Emotionally Abused: No    Physically Abused: No    Sexually Abused: No      Current Outpatient Medications:    ALPRAZolam (XANAX) 0.25 MG tablet, Take 0.125-0.25 mg by mouth daily as needed., Disp: , Rfl:      ROS:  Review of Systems  Constitutional:  Negative for fatigue, fever and unexpected weight change.  Respiratory:  Negative for cough, shortness of breath and wheezing.   Cardiovascular:  Negative for chest pain, palpitations and leg swelling.  Gastrointestinal:  Negative for blood in stool, constipation, diarrhea, nausea and vomiting.  Endocrine: Negative for cold intolerance, heat intolerance and polyuria.  Genitourinary:  Negative for dyspareunia, dysuria, flank pain, frequency, genital sores, hematuria, menstrual problem, pelvic pain, urgency, vaginal bleeding, vaginal discharge and vaginal pain.  Musculoskeletal:  Negative for back pain, joint swelling and myalgias.  Skin:  Negative for rash.  Neurological:  Negative for dizziness, syncope, light-headedness, numbness and headaches.  Hematological:  Negative for adenopathy.  Psychiatric/Behavioral:  Negative for agitation, confusion, dysphoric mood, sleep disturbance and suicidal ideas. The patient is not nervous/anxious.    BREAST: No symptoms   Objective: BP 112/79   Pulse (!) 128   Ht 5\' 6"  (1.676 m)   Wt 208 lb (94.3 kg)   LMP 03/17/2024 (Exact Date)   BMI 33.57 kg/m    Physical Exam Constitutional:      Appearance: She is well-developed.  Genitourinary:     Vulva normal.     Right Labia: No rash, tenderness or lesions.    Left Labia: No tenderness, lesions or rash.    No vaginal discharge, erythema or tenderness.      Right Adnexa: not tender and no mass present.    Left Adnexa: not tender and no mass present.    No cervical friability or polyp.     Uterus is not enlarged or tender.  Breasts:    Right: No mass, nipple discharge, skin change or tenderness.     Left: No mass, nipple discharge, skin change or tenderness.  Neck:     Thyroid: No thyromegaly.   Cardiovascular:     Rate and Rhythm: Normal rate and regular rhythm.     Heart sounds: Normal heart sounds. No murmur heard. Pulmonary:     Effort: Pulmonary effort is normal.     Breath sounds: Normal breath sounds.  Abdominal:     Palpations: Abdomen is soft.     Tenderness: There is no abdominal tenderness. There is no guarding or rebound.  Musculoskeletal:        General: Normal range of motion.     Cervical back: Normal range of motion.  Lymphadenopathy:     Cervical: No cervical adenopathy.  Neurological:     General:  No focal deficit present.     Mental Status: She is alert and oriented to person, place, and time.     Cranial Nerves: No cranial nerve deficit.  Skin:    General: Skin is warm and dry.  Psychiatric:        Mood and Affect: Mood normal.        Behavior: Behavior normal.        Thought Content: Thought content normal.        Judgment: Judgment normal.  Vitals reviewed.     Assessment/Plan: Encounter for annual routine gynecological examination            GYN counsel adequate intake of calcium and vitamin D, diet and exercise; d/c vaping     F/U  Return in about 1 year (around 04/13/2025).  Kyrel Leighton B. Zerenity Bowron, PA-C 04/13/2024 1:38 PM

## 2024-04-13 ENCOUNTER — Encounter: Payer: Self-pay | Admitting: Obstetrics and Gynecology

## 2024-04-13 ENCOUNTER — Ambulatory Visit: Payer: Medicaid Other | Admitting: Obstetrics and Gynecology

## 2024-04-13 VITALS — BP 112/79 | HR 128 | Ht 66.0 in | Wt 208.0 lb

## 2024-04-13 DIAGNOSIS — Z3169 Encounter for other general counseling and advice on procreation: Secondary | ICD-10-CM

## 2024-04-13 DIAGNOSIS — Z01419 Encounter for gynecological examination (general) (routine) without abnormal findings: Secondary | ICD-10-CM | POA: Diagnosis not present

## 2024-04-13 NOTE — Patient Instructions (Signed)
 I value your feedback and you entrusting [place] with your care. If you get a [place] patient survey, I would appreciate you taking the time to let us know about your experience today. Thank you! ? ? ?

## 2024-04-14 ENCOUNTER — Ambulatory Visit (INDEPENDENT_AMBULATORY_CARE_PROVIDER_SITE_OTHER): Admitting: Family Medicine

## 2024-04-14 ENCOUNTER — Encounter: Payer: Self-pay | Admitting: Family Medicine

## 2024-04-14 VITALS — BP 120/74 | HR 103 | Temp 97.7°F | Resp 16 | Ht 66.0 in | Wt 208.0 lb

## 2024-04-14 DIAGNOSIS — R6889 Other general symptoms and signs: Secondary | ICD-10-CM | POA: Diagnosis not present

## 2024-04-14 DIAGNOSIS — R112 Nausea with vomiting, unspecified: Secondary | ICD-10-CM | POA: Diagnosis not present

## 2024-04-14 DIAGNOSIS — R1013 Epigastric pain: Secondary | ICD-10-CM | POA: Diagnosis not present

## 2024-04-14 LAB — POC COVID19/FLU A&B COMBO
Covid Antigen, POC: NEGATIVE
Influenza A Antigen, POC: NEGATIVE
Influenza B Antigen, POC: NEGATIVE

## 2024-04-14 MED ORDER — PROMETHAZINE HCL 25 MG RE SUPP: 25.0000 mg | Freq: Four times a day (QID) | RECTAL | 0 refills | Status: DC | PRN

## 2024-04-14 MED ORDER — PANTOPRAZOLE SODIUM 40 MG PO TBEC
40.0000 mg | DELAYED_RELEASE_TABLET | Freq: Every day | ORAL | 1 refills | Status: DC
Start: 2024-04-14 — End: 2024-11-04

## 2024-04-14 MED ORDER — SUCRALFATE 1 G PO TABS: 1.0000 g | ORAL_TABLET | Freq: Three times a day (TID) | ORAL | 1 refills | Status: DC | PRN

## 2024-04-14 MED ORDER — ONDANSETRON HCL 4 MG PO TABS: 4.0000 mg | ORAL_TABLET | Freq: Three times a day (TID) | ORAL | 0 refills | Status: DC | PRN

## 2024-04-14 MED ORDER — FAMOTIDINE 20 MG PO TABS
20.0000 mg | ORAL_TABLET | Freq: Two times a day (BID) | ORAL | 1 refills | Status: DC | PRN
Start: 2024-04-14 — End: 2024-11-04

## 2024-04-14 NOTE — Patient Instructions (Addendum)
 Avoid alcohol and NSAIDs  Try the zofran, pepcid over the counter, the pantoprazole and carafate for stomach and GI sx  Zofran - for nausea vomiting Pepcid - helps with nausea and neutralizing acid  Pantoprazole over time (1-2 weeks) will help stomach inflammation improve by turning down stomach acid Carafate - temporary - coats esophagus and stomach lining to help with pain  Slowly advance diet - first do only sips with zofran and wait 20-30 minutes before doing anything else  After that only drink 4-8 oz in 30- 60 min and you need to tolerate clear fluids before doing any solids

## 2024-04-14 NOTE — Progress Notes (Signed)
 Patient ID: Jessica Gates, female    DOB: 1995/02/04, 29 y.o.   MRN: [id number]  PCP: Margarita Mail, DO  Chief Complaint  Patient presents with   Abdominal Pain    X3 days, anything she eats or drinks   Emesis    Subjective:   Jessica Gates is a 29 y.o. female, presents to clinic with CC of the following:  HPI  Abd pain upper epigastric area worse with eating/drinking, vomiting x 3 d, pain it is described as squeezing, without radiation, constant Emesis was green and then acidic She is status postcholecystectomy many years ago No sick contacts Her pain is worse with trying to eat or drink anything No fever sweats chills diarrhea She does have a history of cyclical nausea and vomiting she reports she has not had that in quite some time and has not been smoking marijuana She has tried Pepto-Bismol with no improvement She has been trying to drink little sips of water and is able to keep down small amounts but larger amounts hurt her stomach and make her throw up, she has urinated several times today Denies associated urinary symptoms vaginal symptoms  She is tachycardic here, at appointment yesterday she was more tachycardic, does note severe pain but no fever    Pulse Readings from Last 6 Encounters:  04/14/24 (!) 117  04/13/24 (!) 128  12/16/23 93  06/11/23 98  12/07/22 (!) 103  09/17/22 85      Patient Active Problem List   Diagnosis Date Noted   Bipolar I disorder (HCC) 02/01/2022   ADHD 10/10/2020   Anxiety and depression 10/10/2020      Current Outpatient Medications:    ALPRAZolam (XANAX) 0.25 MG tablet, Take 0.125-0.25 mg by mouth daily as needed., Disp: , Rfl:    No Known Allergies   Social History   Tobacco Use   Smoking status: Never   Smokeless tobacco: Never  Vaping Use   Vaping status: Every Day   Substances: Nicotine, Flavoring  Substance Use Topics   Alcohol use: Not Currently    Comment: occ   Drug use: Not  Currently    Types: Marijuana    Comment: percocet      Chart Review Today: I personally reviewed active problem list, medication list, allergies, family history, social history, health maintenance, notes from last encounter, lab results, imaging with the patient/caregiver today.   Review of Systems  Constitutional: Negative.   HENT: Negative.    Eyes: Negative.   Respiratory: Negative.    Cardiovascular: Negative.   Gastrointestinal: Negative.   Endocrine: Negative.   Genitourinary: Negative.   Musculoskeletal: Negative.   Skin: Negative.   Allergic/Immunologic: Negative.   Neurological: Negative.   Hematological: Negative.   Psychiatric/Behavioral: Negative.    All other systems reviewed and are negative.      Objective:   Vitals:   04/14/24 1315  BP: 120/74  Pulse: (!) 117  Resp: 16  Temp: 97.7 F (36.5 C)  SpO2: 92%  Weight: 208 lb (94.3 kg)  Height: 5\' 6"  (1.676 m)    Body mass index is 33.57 kg/m.  Physical Exam Vitals and nursing note reviewed.  Constitutional:      General: She is not in acute distress.    Appearance: Normal appearance. She is well-developed. She is obese. She is not ill-appearing, toxic-appearing or diaphoretic.  HENT:     Head: Normocephalic and atraumatic.     Right Ear: External ear normal.  Left Ear: External ear normal.     Nose: Nose normal.     Mouth/Throat:     Mouth: Mucous membranes are moist.     Pharynx: Oropharynx is clear. No oropharyngeal exudate.  Eyes:     General: No scleral icterus.       Right eye: No discharge.        Left eye: No discharge.     Conjunctiva/sclera: Conjunctivae normal.  Neck:     Trachea: No tracheal deviation.  Cardiovascular:     Rate and Rhythm: Normal rate and regular rhythm.     Pulses: Normal pulses.     Heart sounds: Normal heart sounds.  Pulmonary:     Effort: Pulmonary effort is normal. No respiratory distress.     Breath sounds: Normal breath sounds. No stridor.   Abdominal:     General: Bowel sounds are normal. There is no distension.     Palpations: Abdomen is soft. There is no mass.     Tenderness: There is abdominal tenderness (to epigastric RUQ and LUQ). There is no right CVA tenderness or left CVA tenderness.  Skin:    General: Skin is warm and dry.     Coloration: Skin is not jaundiced.     Findings: No rash.  Neurological:     Mental Status: She is alert.     Motor: No abnormal muscle tone.     Coordination: Coordination normal.  Psychiatric:        Mood and Affect: Mood normal.        Behavior: Behavior normal.      Results for orders placed or performed in visit on 06/11/23  POCT urinalysis dipstick   Collection Time: 06/11/23 11:30 AM  Result Value Ref Range   Color, UA gold    Clarity, UA cloudy    Glucose, UA Negative Negative   Bilirubin, UA negative    Ketones, UA negative    Spec Grav, UA 1.020 1.010 - 1.025   Blood, UA large    pH, UA 6.5 5.0 - 8.0   Protein, UA Positive (A) Negative   Urobilinogen, UA 0.2 0.2 or 1.0 E.U./dL   Nitrite, UA positive    Leukocytes, UA Large (3+) (A) Negative   Appearance cloudy    Odor strong   Urine Culture   Collection Time: 06/11/23 12:40 PM   Specimen: Urine  Result Value Ref Range   MICRO NUMBER: [id number]    SPECIMEN QUALITY: Adequate    Sample Source URINE    STATUS: FINAL    ISOLATE 1: Escherichia coli (A)       Susceptibility   Escherichia coli - URINE CULTURE, REFLEX    AMOX/CLAVULANIC 4 Sensitive     AMPICILLIN >=32 Resistant     AMPICILLIN/SULBACTAM 16 Intermediate     CEFAZOLIN* <=4 Not Reportable      * For infections other than uncomplicated UTI caused by E. coli, K. pneumoniae or P. mirabilis: Cefazolin is resistant if MIC > or = 8 mcg/mL. (Distinguishing susceptible versus intermediate for isolates with MIC < or = 4 mcg/mL requires additional testing.) For uncomplicated UTI caused by E. coli, K. pneumoniae or P. mirabilis: Cefazolin is susceptible if  MIC <32 mcg/mL and predicts susceptible to the oral agents cefaclor, cefdinir, cefpodoxime, cefprozil, cefuroxime, cephalexin and loracarbef.     CEFTAZIDIME <=1 Sensitive     CEFEPIME <=1 Sensitive     CEFTRIAXONE <=1 Sensitive     CIPROFLOXACIN <=0.25 Sensitive  LEVOFLOXACIN <=0.12 Sensitive     GENTAMICIN <=1 Sensitive     IMIPENEM <=0.25 Sensitive     NITROFURANTOIN <=16 Sensitive     PIP/TAZO <=4 Sensitive     TOBRAMYCIN <=1 Sensitive     TRIMETH/SULFA* >=320 Resistant      * For infections other than uncomplicated UTI caused by E. coli, K. pneumoniae or P. mirabilis: Cefazolin is resistant if MIC > or = 8 mcg/mL. (Distinguishing susceptible versus intermediate for isolates with MIC < or = 4 mcg/mL requires additional testing.) For uncomplicated UTI caused by E. coli, K. pneumoniae or P. mirabilis: Cefazolin is susceptible if MIC <32 mcg/mL and predicts susceptible to the oral agents cefaclor, cefdinir, cefpodoxime, cefprozil, cefuroxime, cephalexin and loracarbef. Legend: S = Susceptible  I = Intermediate R = Resistant  NS = Not susceptible * = Not tested  NR = Not reported **NN = See antimicrobic comments        Assessment & Plan:   1. Epigastric pain (Primary) Upper abd pain/ttp with N/V Ddx gastritis, GERD, PUD, pancreatitis Tx with antiemetics, PPI, Carafate, labs to evaluate liver enzymes and lipase She does have GI specialist at [facility] that she can follow-up with if not improving Possibly viral etiology? Advised to sip small amounts of clear fluids, slowly advance diet first with liquids before solids, avoid NSAIDs and alcohol ER precautions reviewed at length and patient verbalized understanding - CBC with Differential/Platelet - Comprehensive metabolic panel with GFR - Lipase - pantoprazole (PROTONIX) 40 MG tablet; Take 1 tablet (40 mg total) by mouth daily.  Dispense: 30 tablet; Refill: 1 - sucralfate (CARAFATE) 1 g tablet; Take 1 tablet (1 g  total) by mouth 3 (three) times daily with meals as needed. for GERD/reflux/epigastric pain when eating or drinking  Dispense: 30 tablet; Refill: 1 - promethazine (PHENERGAN) 25 MG suppository; Place 1 suppository (25 mg total) rectally every 6 (six) hours as needed for refractory nausea / vomiting.  Dispense: 12 each; Refill: 0 - famotidine (PEPCID) 20 MG tablet; Take 1 tablet (20 mg total) by mouth 2 (two) times daily as needed for heartburn or indigestion.  Dispense: 60 tablet; Refill: 1  2. Nausea and vomiting, unspecified vomiting type See #1 - ondansetron (ZOFRAN) 4 MG tablet; Take 1-2 tablets (4-8 mg total) by mouth every 8 (eight) hours as needed for nausea or vomiting.  Dispense: 20 tablet; Refill: 0 - CBC with Differential/Platelet - Comprehensive metabolic panel with GFR - Lipase - promethazine (PHENERGAN) 25 MG suppository; Place 1 suppository (25 mg total) rectally every 6 (six) hours as needed for refractory nausea / vomiting.  Dispense: 12 each; Refill: 0 - famotidine (PEPCID) 20 MG tablet; Take 1 tablet (20 mg total) by mouth 2 (two) times daily as needed for heartburn or indigestion.  Dispense: 60 tablet; Refill: 1  3. Flu-like symptoms neg - POC Covid19/Flu A&B Antigen  Tachycardia -baseline heart rate is 90s to low 100s she was initially 110's  and the lowest heart rate recorded today was 103, appears well-hydrated.  Did not suspect sepsis or surgical abdomen ER precautions reviewed       Adeline Hone, PA-C 04/14/24 1:24 PM

## 2024-04-15 ENCOUNTER — Encounter: Payer: Self-pay | Admitting: Family Medicine

## 2024-04-15 LAB — COMPREHENSIVE METABOLIC PANEL WITH GFR
AG Ratio: 1.3 (calc) (ref 1.0–2.5)
ALT: 15 U/L (ref 6–29)
AST: 13 U/L (ref 10–30)
Albumin: 4.7 g/dL (ref 3.6–5.1)
Alkaline phosphatase (APISO): 122 U/L (ref 31–125)
BUN/Creatinine Ratio: 16 (calc) (ref 6–22)
BUN: 16 mg/dL (ref 7–25)
CO2: 31 mmol/L (ref 20–32)
Calcium: 10.6 mg/dL — ABNORMAL HIGH (ref 8.6–10.2)
Chloride: 98 mmol/L (ref 98–110)
Creat: 1 mg/dL — ABNORMAL HIGH (ref 0.50–0.96)
Globulin: 3.5 g/dL (ref 1.9–3.7)
Glucose, Bld: 104 mg/dL — ABNORMAL HIGH (ref 65–99)
Potassium: 4.9 mmol/L (ref 3.5–5.3)
Sodium: 138 mmol/L (ref 135–146)
Total Bilirubin: 0.4 mg/dL (ref 0.2–1.2)
Total Protein: 8.2 g/dL — ABNORMAL HIGH (ref 6.1–8.1)
eGFR: 79 mL/min/{1.73_m2} (ref >=60)

## 2024-04-15 LAB — CBC WITH DIFFERENTIAL/PLATELET
Absolute Lymphocytes: 992 {cells}/uL (ref 850–3900)
Absolute Monocytes: 311 {cells}/uL (ref 200–950)
Basophils Absolute: 37 {cells}/uL (ref 0–200)
Basophils Relative: 0.5 %
Eosinophils Absolute: 7 {cells}/uL — ABNORMAL LOW (ref 15–500)
Eosinophils Relative: 0.1 %
HCT: 44.7 % (ref 35.0–45.0)
Hemoglobin: 14.9 g/dL (ref 11.7–15.5)
MCH: 30.3 pg (ref 27.0–33.0)
MCHC: 33.3 g/dL (ref 32.0–36.0)
MCV: 91 fL (ref 80.0–100.0)
MPV: 10 fL (ref 7.5–12.5)
Monocytes Relative: 4.2 %
Neutro Abs: 6053 {cells}/uL (ref 1500–7800)
Neutrophils Relative %: 81.8 %
Platelets: 401 10*3/uL — ABNORMAL HIGH (ref 140–400)
RBC: 4.91 10*6/uL (ref 3.80–5.10)
RDW: 12.6 % (ref 11.0–15.0)
Total Lymphocyte: 13.4 %
WBC: 7.4 10*3/uL (ref 3.8–10.8)

## 2024-04-15 LAB — LIPASE: Lipase: 9 U/L (ref 7–60)

## 2024-04-28 ENCOUNTER — Ambulatory Visit (INDEPENDENT_AMBULATORY_CARE_PROVIDER_SITE_OTHER): Admitting: Internal Medicine

## 2024-04-28 ENCOUNTER — Other Ambulatory Visit: Payer: Self-pay | Admitting: Internal Medicine

## 2024-04-28 ENCOUNTER — Other Ambulatory Visit: Payer: Self-pay

## 2024-04-28 ENCOUNTER — Encounter: Payer: Self-pay | Admitting: Internal Medicine

## 2024-04-28 VITALS — BP 120/82 | HR 100 | Temp 97.8°F | Resp 16 | Ht 66.0 in | Wt 214.8 lb

## 2024-04-28 DIAGNOSIS — R1013 Epigastric pain: Secondary | ICD-10-CM

## 2024-04-28 NOTE — Progress Notes (Signed)
 Acute Office Visit  Subjective:     Patient ID: Jessica Gates, female    DOB: 1995-06-15, 29 y.o.   MRN: [id number]  Chief Complaint  Patient presents with   Follow-up    2 week recheck    HPI Patient is in today for follow up on epigastric pain.   Discussed the use of AI scribe software for clinical note transcription with the patient, who gave verbal consent to proceed.  History of Present Illness Jessica Gates is a 29 year old female who presents with chronic vomiting and abdominal pain.  She experiences chronic vomiting and upper abdominal pain. She is on pantoprazole  40 mg, Carafate , and Pepcid  for acid reflux. Pantoprazole  does not fully alleviate symptoms. Carafate  is taken once daily instead of the prescribed three times with meals. Pepcid  is used once daily as needed, though intended for more frequent use. She has not had an upper GI scope. Recent labs show slightly elevated calcium levels, possibly due to frequent Tums use, with normal lipase levels. She has not been tested for H. pylori. No bowel or urinary symptoms are present.    Review of Systems  Constitutional:  Negative for chills and fever.  Gastrointestinal:  Positive for abdominal pain and heartburn. Negative for constipation, diarrhea, nausea and vomiting.  Genitourinary:  Negative for dysuria, frequency, hematuria and urgency.        Objective:    BP 120/82 (Cuff Size: Large)   Pulse 100   Temp 97.8 F (36.6 C) (Oral)   Resp 16   Ht 5\' 6"  (1.676 m)   Wt 214 lb 12.8 oz (97.4 kg)   LMP 03/17/2024 (Exact Date)   SpO2 99%   BMI 34.67 kg/m  BP Readings from Last 3 Encounters:  04/28/24 120/82  04/14/24 120/74  04/13/24 112/79   Wt Readings from Last 3 Encounters:  04/28/24 214 lb 12.8 oz (97.4 kg)  04/14/24 208 lb (94.3 kg)  04/13/24 208 lb (94.3 kg)      Physical Exam Constitutional:      Appearance: Normal appearance.  HENT:     Head: Normocephalic and atraumatic.  Eyes:      Conjunctiva/sclera: Conjunctivae normal.  Cardiovascular:     Rate and Rhythm: Normal rate and regular rhythm.  Pulmonary:     Effort: Pulmonary effort is normal.     Breath sounds: Normal breath sounds.  Abdominal:     General: Bowel sounds are normal. There is no distension.     Palpations: Abdomen is soft. There is no mass.     Tenderness: There is no abdominal tenderness. There is no guarding or rebound.     Hernia: No hernia is present.  Skin:    General: Skin is warm and dry.  Neurological:     General: No focal deficit present.     Mental Status: She is alert. Mental status is at baseline.  Psychiatric:        Mood and Affect: Mood normal.        Behavior: Behavior normal.     No results found for any visits on 04/28/24.      Assessment & Plan:   Assessment & Plan Gastroesophageal reflux disease Chronic GERD with persistent symptoms despite maximum pantoprazole . Noncompliance with Carafate  noted. Possible peptic ulcer disease or H. pylori infection. - Continue pantoprazole  40 mg daily. - Instruct to take Carafate  three times daily with meals. - Use Pepcid  as needed for breakthrough symptoms. - Order H. pylori breath  test. - Refer to GI for evaluation and possible upper GI endoscopy (EGD).  Peptic ulcer disease Suspected peptic ulcer disease due to persistent upper abdominal pain and vomiting. Differential includes gastric ulcers or H. pylori infection. - Order H. pylori breath test. - Refer to GI for evaluation and possible upper GI endoscopy (EGD).  - H. pylori breath test  Return if symptoms worsen or fail to improve.  Rockney Cid, DO

## 2024-04-29 ENCOUNTER — Encounter: Payer: Self-pay | Admitting: Internal Medicine

## 2024-04-29 LAB — H. PYLORI BREATH TEST: H. pylori Breath Test: NOT DETECTED

## 2024-05-29 ENCOUNTER — Ambulatory Visit (INDEPENDENT_AMBULATORY_CARE_PROVIDER_SITE_OTHER): Admitting: Nurse Practitioner

## 2024-05-29 ENCOUNTER — Encounter: Payer: Self-pay | Admitting: Nurse Practitioner

## 2024-05-29 ENCOUNTER — Ambulatory Visit: Payer: Self-pay | Admitting: Internal Medicine

## 2024-05-29 ENCOUNTER — Ambulatory Visit: Attending: Nurse Practitioner

## 2024-05-29 VITALS — BP 122/68 | HR 90 | Temp 97.4°F | Ht 66.0 in | Wt 207.7 lb

## 2024-05-29 DIAGNOSIS — R42 Dizziness and giddiness: Secondary | ICD-10-CM

## 2024-05-29 NOTE — Telephone Encounter (Signed)
 Chief Complaint: Dizziness when standing up and walking x2-3 months, getting worse  Symptoms: Foggy, legs feel weak Frequency: Happens every time patient stands up Pertinent Negatives: Patient denies fever, chest pain, nausea, vomiting, difficulty breathing, numbness, changes in speech  Disposition: [x] Appointment(In office)  Additional Notes: Patient scheduled for an appointment today.     Copied from CRM [id number]. Topic: Clinical - Red Word Triage >> May 29, 2024  2:01 PM Kevelyn M wrote: Red Word that prompted transfer to Nurse Triage: When patient gets up and walk the patient get dizzy and lightheaded and this has been happening for 2 or 3 months and it's worse. Reason for Disposition  [1] MODERATE dizziness (e.g., interferes with normal activities) AND [2] has NOT been evaluated by doctor (or NP/PA) for this  (Exception: Dizziness caused by heat exposure, sudden standing, or poor fluid intake.)  Answer Assessment - Initial Assessment Questions 1. DESCRIPTION: "Describe your dizziness."     Dizzy when standing up 2. LIGHTHEADED: "Do you feel lightheaded?" (e.g., somewhat faint, woozy, weak upon standing)     Legs feel weak 3. VERTIGO: "Do you feel like either you or the room is spinning or tilting?" (i.e. vertigo)     "Kind of" 4. SEVERITY: "How bad is it?"  "Do you feel like you are going to faint?" "Can you stand and walk?"   - MILD: Feels slightly dizzy, but walking normally.   - MODERATE: Feels unsteady when walking, but not falling; interferes with normal activities (e.g., school, work).   - SEVERE: Unable to walk without falling, or requires assistance to walk without falling; feels like passing out now.      Moderate, "can't make it far without sitting down" 5. ONSET:  "When did the dizziness begin?"     2-3 months 6. AGGRAVATING FACTORS: "Does anything make it worse?" (e.g., standing, change in head position)     No 7. HEART RATE: "Can you tell me your heart rate?" "How  many beats in 15 seconds?"  (Note: not all patients can do this)       No 8. CAUSE: "What do you think is causing the dizziness?"     Not sure 10. OTHER SYMPTOMS: "Do you have any other symptoms?" (e.g., fever, chest pain, vomiting, diarrhea, bleeding)       No  Protocols used: Dizziness - Lightheadedness-A-AH

## 2024-05-29 NOTE — Progress Notes (Addendum)
 BP 122/68 (BP Location: Left Arm, Patient Position: Sitting, Cuff Size: Normal)   Pulse 90   Temp (!) 97.4 F (36.3 C) (Oral)   Ht 5\' 6"  (1.676 m)   Wt 207 lb 11.2 oz (94.2 kg)   LMP 05/23/2024   SpO2 100%   BMI 33.52 kg/m    Subjective:    Patient ID: Jessica Gates, female    DOB: Feb 02, 1995, 29 y.o.   MRN: [id number]  HPI: Jessica Gates is a 30 y.o. female  Chief Complaint  Patient presents with   Dizziness    Onset 2 month, worsened 2 weeks ago. Feels dizzy when she stands and starts walking     Discussed the use of AI scribe software for clinical note transcription with the patient, who gave verbal consent to proceed.  History of Present Illness Jessica Gates is a 29 year old female who presents with dizziness upon standing and walking.  She has been experiencing dizziness for the past two to three months, occurring every time she stands up and starts walking. The dizziness is described as a 'foggy' sensation in her head and a feeling of impending fainting, necessitating her to sit down immediately. The dizziness resolves after sitting down, although it takes a few minutes for her to feel stable enough to stand again.  She denies any previous episodes of similar dizziness. She also reports feeling short of breath upon standing and, if she does not sit down in time, she experiences palpitations. No chest pain is reported.         05/29/2024    2:51 PM 12/16/2023   11:41 AM 06/11/2023   11:26 AM  Depression screen PHQ 2/9  Decreased Interest 0 0 0  Down, Depressed, Hopeless 0 0 0  PHQ - 2 Score 0 0 0  Altered sleeping 0 0 0  Tired, decreased energy 0 0 0  Change in appetite 0 0 0  Feeling bad or failure about yourself  0 0 0  Trouble concentrating 0 0 0  Moving slowly or fidgety/restless 0 0 0  Suicidal thoughts 0 0 0  PHQ-9 Score 0 0 0  Difficult doing work/chores Not difficult at all  Not difficult at all    Relevant past medical, surgical,  family and social history reviewed and updated as indicated. Interim medical history since our last visit reviewed. Allergies and medications reviewed and updated.  Review of Systems  Ten systems reviewed and is negative except as mentioned in HPI      Objective:      BP 122/68 (BP Location: Left Arm, Patient Position: Sitting, Cuff Size: Normal)   Pulse 90   Temp (!) 97.4 F (36.3 C) (Oral)   Ht 5\' 6"  (1.676 m)   Wt 207 lb 11.2 oz (94.2 kg)   LMP 05/23/2024   SpO2 100%   BMI 33.52 kg/m    Wt Readings from Last 3 Encounters:  05/29/24 207 lb 11.2 oz (94.2 kg)  04/28/24 214 lb 12.8 oz (97.4 kg)  04/14/24 208 lb (94.3 kg)      Orthostatic Vitals for the past 48 hrs (Last 6 readings):  Patient Position BP Pulse BP Location Cuff Size Patient Position (if appropriate) BP- Standing at 0 minutes Pulse- Standing at 0 minutes BP- Sitting Pulse- Sitting BP- Lying Pulse- Lying  05/29/24 1449 Sitting 122/68 90 Left Arm Normal -- -- -- -- -- -- --  05/29/24 1512 -- -- -- -- -- Orthostatic Vitals 120/74  130 122/74 101 112/60 80    Physical Exam Vitals reviewed.  Constitutional:      Appearance: Normal appearance.  HENT:     Head: Normocephalic.  Cardiovascular:     Rate and Rhythm: Normal rate and regular rhythm.  Pulmonary:     Effort: Pulmonary effort is normal.     Breath sounds: Normal breath sounds.  Musculoskeletal:        General: Normal range of motion.  Skin:    General: Skin is warm and dry.  Neurological:     General: No focal deficit present.     Mental Status: She is alert and oriented to person, place, and time. Mental status is at baseline.     Cranial Nerves: Cranial nerves 2-12 are intact.     Sensory: Sensation is intact.     Motor: Motor function is intact.     Coordination: Coordination is intact.     Gait: Gait is intact.  Psychiatric:        Mood and Affect: Mood normal.        Behavior: Behavior normal.        Thought Content: Thought content  normal.        Judgment: Judgment normal.       EKG: normal sinus rhythm.         Assessment & Plan:   Problem List Items Addressed This Visit   None Visit Diagnoses       Dizziness    -  Primary   Relevant Orders   EKG 12-Lead   Orthostatic vital signs   LONG TERM MONITOR (3-14 DAYS)   CBC with Differential/Platelet   Comprehensive metabolic panel with GFR   TSH   Hemoglobin A1c   B12 and Folate Panel        Assessment and Plan Assessment & Plan Dizziness with orthostatic tachycardia Dizziness began 2-3 months ago, worsened 2 weeks ago, and occurs upon standing and walking. Symptoms include foggy-headedness and near syncope, relieved by sitting. Orthostatic vital signs show heart rate increase to 130 bpm upon standing, with stable blood pressure. Differential diagnosis includes dehydration and Postural Orthostatic Tachycardia Syndrome (POTS). EKG was normal. Discussed POTS, characterized by increased heart rate with positional changes, explaining dizziness. Emphasized hydration as dehydration could contribute to symptoms. Plan to evaluate with blood work and Ziopatch monitoring. If results suggest POTS, consider cardiology referral for tilt table test. - Order blood work - Prescribe Ziopatch heart monitor - Advise increased fluid intake with electrolytes ( gatorade) - Consider referral to cardiology for tilt table test if POTS is suspected        Follow up plan: Return if symptoms worsen or fail to improve.

## 2024-07-16 ENCOUNTER — Ambulatory Visit: Payer: Self-pay | Admitting: Nurse Practitioner

## 2024-07-16 DIAGNOSIS — R42 Dizziness and giddiness: Secondary | ICD-10-CM | POA: Diagnosis not present

## 2024-08-07 ENCOUNTER — Ambulatory Visit: Admitting: Family Medicine

## 2024-08-19 ENCOUNTER — Ambulatory Visit: Admitting: Internal Medicine

## 2024-08-20 ENCOUNTER — Encounter: Payer: Self-pay | Admitting: Internal Medicine

## 2024-08-20 ENCOUNTER — Ambulatory Visit (INDEPENDENT_AMBULATORY_CARE_PROVIDER_SITE_OTHER): Admitting: Internal Medicine

## 2024-08-20 ENCOUNTER — Other Ambulatory Visit: Payer: Self-pay

## 2024-08-20 VITALS — BP 124/82 | HR 94 | Temp 98.1°F | Resp 16 | Ht 66.0 in | Wt 221.1 lb

## 2024-08-20 DIAGNOSIS — R42 Dizziness and giddiness: Secondary | ICD-10-CM

## 2024-08-20 MED ORDER — ADULT BLOOD PRESSURE CUFF LG KIT: 1.0000 | PACK | Freq: Every day | 0 refills | Status: DC

## 2024-08-20 NOTE — Progress Notes (Signed)
   Acute Office Visit  Subjective:     Patient ID: Jessica Gates, female    DOB: [birth year], 29 y.o.   MRN: [id number]  Chief Complaint  Patient presents with   Dizziness    For 2 months    Dizziness   Patient is in today for dizziness.  Discussed the use of AI scribe software for clinical note transcription with the patient, who gave verbal consent to proceed.  History of Present Illness Jessica Gates is a 29 year old female who presents with dizziness upon standing.  Dizziness has been present for two weeks, occurring specifically when transitioning from sitting or lying down to standing. The sensation is described as the 'room spinning' and is associated with positional changes. There are no symptoms of sinus pressure or ear pain. She has experienced falls due to dizziness but has not sustained injuries. She does not take medications for blood pressure and mentions that her mother likely has a blood pressure cuff at home.   Review of Systems  Neurological:  Positive for dizziness.        Objective:    BP 124/82 (Cuff Size: Large)   Pulse 94   Temp 98.1 F (36.7 C) (Oral)   Resp 16   Ht 5' 6 (1.676 m)   Wt 221 lb 1.6 oz (100.3 kg)   SpO2 99%   BMI 35.69 kg/m  BP Readings from Last 3 Encounters:  08/20/24 124/82  05/29/24 122/68  04/28/24 120/82   Wt Readings from Last 3 Encounters:  08/20/24 221 lb 1.6 oz (100.3 kg)  05/29/24 207 lb 11.2 oz (94.2 kg)  04/28/24 214 lb 12.8 oz (97.4 kg)      Physical Exam Constitutional:      Appearance: Normal appearance.  HENT:     Head: Normocephalic and atraumatic.  Eyes:     Conjunctiva/sclera: Conjunctivae normal.  Cardiovascular:     Rate and Rhythm: Normal rate and regular rhythm.  Pulmonary:     Effort: Pulmonary effort is normal.     Breath sounds: Normal breath sounds.  Skin:    General: Skin is warm and dry.  Neurological:     General: No focal deficit present.     Mental Status: She is  alert. Mental status is at baseline.  Psychiatric:        Mood and Affect: Mood normal.        Behavior: Behavior normal.     No results found for any visits on 08/20/24.      Assessment & Plan:   Assessment & Plan Orthostatic dizziness (suspected orthostatic hypotension) Dizziness with positional changes suggests orthostatic hypotension. Normal sitting blood pressure. No syncope, but falls noted. Dehydration may worsen symptoms. - Perform orthostatic vital signs to confirm diagnosis. - Prescribe blood pressure cuff for home monitoring if insurance covers. - Advise hydration with at least 60 ounces of water daily. - Recommend electrolyte drinks like Gatorade or Powerade if sweating or exercising. - Provide printed information on orthostatic hypotension. - Advise follow-up if symptoms worsen, consider blood work.   Return if symptoms worsen or fail to improve.  Sharyle Fischer, DO

## 2024-08-20 NOTE — Patient Instructions (Signed)
 Orthostatic Hypotension Blood pressure is a measurement of how strongly, or weakly, your circulating blood is pressing against the walls of your arteries. Orthostatic hypotension is a drop in blood pressure that can happen when you change positions, such as when you go from lying down to standing. Arteries are blood vessels that carry blood from your heart throughout your body. When blood pressure is too low, you may not get enough blood to your brain or to the rest of your organs. Orthostatic hypotension can cause light-headedness, sweating, rapid heartbeat, blurred vision, and fainting. These symptoms require further investigation into the cause. What are the causes? Orthostatic hypotension can be caused by many things, including: Sudden changes in posture, such as standing up quickly after you have been sitting or lying down. Loss of blood (anemia) or loss of body fluids (dehydration). Heart problems, neurologic problems, or hormone problems. Pregnancy. Aging. The risk for this condition increases as you get older. Severe infection (sepsis). Certain medicines, such as medicines for high blood pressure or medicines that make the body lose excess fluids (diuretics). What are the signs or symptoms? Symptoms of this condition may include: Weakness, light-headedness, or dizziness. Sweating. Blurred vision. Tiredness (fatigue). Rapid heartbeat. Fainting, in severe cases. How is this diagnosed? This condition is diagnosed based on: Your symptoms and medical history. Your blood pressure measurements. Your health care provider will check your blood pressure when you are: Lying down. Sitting. Standing. A blood pressure reading is recorded as two numbers, such as "120 over 80" (or 120/80). The first ("top") number is called the systolic pressure. It is a measure of the pressure in your arteries as your heart beats. The second ("bottom") number is called the diastolic pressure. It is a measure of  the pressure in your arteries when your heart relaxes between beats. Blood pressure is measured in a unit called mmHg. Healthy blood pressure for most adults is 120/80 mmHg. Orthostatic hypotension is defined as a 20 mmHg drop in systolic pressure or a 10 mmHg drop in diastolic pressure within 3 minutes of standing. Other information or tests that may be used to diagnose orthostatic hypotension include: Your other vital signs, such as your heart rate and temperature. Blood tests. An electrocardiogram (ECG) or echocardiogram. A Holter monitor. This is a device you wear that records your heart rhythm continuously, usually for 24-48 hours. Tilt table test. For this test, you will be safely secured to a table that moves you from a lying position to an upright position. Your heart rhythm and blood pressure will be monitored during the test. How is this treated? This condition may be treated by: Changing your diet. This may involve eating more salt (sodium) or drinking more water. Changing the dosage of certain medicines you are taking that might be lowering your blood pressure. Correcting the underlying reason for the orthostatic hypotension. Wearing compression stockings. Taking medicines to raise your blood pressure. Avoiding actions that trigger symptoms. Follow these instructions at home: Medicines Take over-the-counter and prescription medicines only as told by your health care provider. Follow instructions from your health care provider about changing the dosage of your current medicines, if this applies. Do not stop or adjust any of your medicines on your own. Eating and drinking  Drink enough fluid to keep your urine pale yellow. Eat extra salt only as directed. Do not add extra salt to your diet unless advised by your health care provider. Eat frequent, small meals. Avoid standing up suddenly after eating. General instructions  Get up slowly from lying down or sitting positions. This  gives your blood pressure a chance to adjust. Avoid hot showers and excessive heat as directed by your health care provider. Engage in regular physical activity as directed by your health care provider. If you have compression stockings, wear them as told. Keep all follow-up visits. This is important. Contact a health care provider if: You have a fever for more than 2-3 days. You feel more thirsty than usual. You feel dizzy or weak. Get help right away if: You have chest pain. You have a fast or irregular heartbeat. You become sweaty or feel light-headed. You feel short of breath. You faint. You have any symptoms of a stroke. "BE FAST" is an easy way to remember the main warning signs of a stroke: B - Balance. Signs are dizziness, sudden trouble walking, or loss of balance. E - Eyes. Signs are trouble seeing or a sudden change in vision. F - Face. Signs are sudden weakness or numbness of the face, or the face or eyelid drooping on one side. A - Arms. Signs are weakness or numbness in an arm. This happens suddenly and usually on one side of the body. S - Speech. Signs are sudden trouble speaking, slurred speech, or trouble understanding what people say. T - Time. Time to call emergency services. Write down what time symptoms started. You have other signs of a stroke, such as: A sudden, severe headache with no known cause. Nausea or vomiting. Seizure. These symptoms may represent a serious problem that is an emergency. Do not wait to see if the symptoms will go away. Get medical help right away. Call your local emergency services (911 in the [place] Do not drive yourself to the hospital. Summary Orthostatic hypotension is a sudden drop in blood pressure. It can cause light-headedness, sweating, rapid heartbeat, blurred vision, and fainting. Orthostatic hypotension can be diagnosed by having your blood pressure taken while lying down, sitting, and then standing. Treatment may involve  changing your diet, wearing compression stockings, sitting up slowly, adjusting your medicines, or correcting the underlying reason for the orthostatic hypotension. Get help right away if you have chest pain, a fast or irregular heartbeat, or symptoms of a stroke. This information is not intended to replace advice given to you by your health care provider. Make sure you discuss any questions you have with your health care provider. Document Revised: 03/02/2021 Document Reviewed: 03/02/2021 Elsevier Patient Education  2024 [employer].

## 2024-08-27 ENCOUNTER — Ambulatory Visit (INDEPENDENT_AMBULATORY_CARE_PROVIDER_SITE_OTHER): Admitting: Internal Medicine

## 2024-08-27 ENCOUNTER — Encounter: Payer: Self-pay | Admitting: Internal Medicine

## 2024-08-27 ENCOUNTER — Other Ambulatory Visit: Payer: Self-pay

## 2024-08-27 VITALS — BP 120/80 | HR 102 | Temp 98.3°F | Resp 16 | Ht 66.0 in | Wt 218.6 lb

## 2024-08-27 DIAGNOSIS — R000 Tachycardia, unspecified: Secondary | ICD-10-CM | POA: Diagnosis not present

## 2024-08-27 DIAGNOSIS — R42 Dizziness and giddiness: Secondary | ICD-10-CM | POA: Diagnosis not present

## 2024-08-27 DIAGNOSIS — L853 Xerosis cutis: Secondary | ICD-10-CM | POA: Diagnosis not present

## 2024-08-27 NOTE — Progress Notes (Signed)
 Acute Office Visit  Subjective:     Patient ID: Jessica Gates, female    DOB: 1995-07-07, 29 y.o.   MRN: [id number]  Chief Complaint  Patient presents with   Consult    Check to see if she is Diabetic   foot ulcers    bilateral    HPI Patient is in today for issues with feet and concerns about diabetes. She is here with her mother today.   Discussed the use of AI scribe software for clinical note transcription with the patient, who gave verbal consent to proceed.  History of Present Illness Jessica Gates is a 29 year old female who presents with concerns about possible diabetes and foot blisters. She is accompanied by her mother, Jessica Gates.  She has significant blisters on the balls of her feet, with extensive skin peeling. There is no history of burns or stepping on hot surfaces.  Dizziness is present but improving. She manages it by sitting longer before standing.  Her heart rate was 102 upon arrival and 100 previously, with no chest pain or palpitations. An EKG in May was normal, and a heart monitor showed sinus rhythm and sinus tachycardia.  In April, her blood sugar was 104, with previous readings of 86 and 90 in 2023. She is unsure if these were fasting levels and seeks further testing to confirm diabetes status.  She recently purchased a blood pressure cuff to monitor her heart rate at home.    Review of Systems  Neurological:  Positive for dizziness.        Objective:    BP 120/80 (Cuff Size: Large)   Pulse (!) 102   Temp 98.3 F (36.8 C) (Oral)   Resp 16   Ht 5' 6 (1.676 m)   Wt 218 lb 9.6 oz (99.2 kg)   SpO2 99%   BMI 35.28 kg/m  BP Readings from Last 3 Encounters:  08/27/24 120/80  08/20/24 124/82  05/29/24 122/68   Wt Readings from Last 3 Encounters:  08/27/24 218 lb 9.6 oz (99.2 kg)  08/20/24 221 lb 1.6 oz (100.3 kg)  05/29/24 207 lb 11.2 oz (94.2 kg)      Physical Exam Constitutional:      Appearance: Normal appearance.   HENT:     Head: Normocephalic and atraumatic.  Eyes:     Conjunctiva/sclera: Conjunctivae normal.  Cardiovascular:     Rate and Rhythm: Regular rhythm. Tachycardia present.  Pulmonary:     Effort: Pulmonary effort is normal.     Breath sounds: Normal breath sounds.  Skin:    General: Skin is warm and dry.     Comments: Patches of dry peeling skin on bilateral bottoms of feet   Neurological:     General: No focal deficit present.     Mental Status: She is alert. Mental status is at baseline.  Psychiatric:        Mood and Affect: Mood normal.        Behavior: Behavior normal.     No results found for any visits on 08/27/24.      Assessment & Plan:   Assessment & Plan Dry skin with superficial foot blisters Blisters due to dry skin, not ulcers, with healthy skin underneath. Likely from walking pressure. Not diabetes-related. Moisturizing essential to prevent recurrence. - Advise moisturizing feet twice daily, especially after bathing and before bed. - Recommend using [employer] for diabetics or any fragrance-free moisturizer. - Advise against picking at the skin; allow it  to come off naturally. - Suggest using a pumice stone if necessary.  Orthostatic dizziness Dizziness likely orthostatic, related to blood pressure drop upon standing. Exacerbated by dehydration, especially in hot weather. - Advise taking time when changing positions, especially in the morning. - Encourage increased water intake to prevent dehydration.  Sinus tachycardia Heart rate slightly elevated at 102 bpm, consistent with previous visits. No cardiac symptoms. Previous EKG and heart monitor normal. Dehydration could contribute. - Encourage adequate hydration. - Suggest monitoring heart rate at home using a pulse oximeter or blood pressure cuff with heart rate monitoring. - Order labs to check for anemia, kidney function, liver function, electrolytes, thyroid function, A1c, and vitamin B12.    Return  if symptoms worsen or fail to improve.  Sharyle Fischer, DO

## 2024-08-28 LAB — COMPREHENSIVE METABOLIC PANEL WITH GFR
AG Ratio: 1.4 (calc) (ref 1.0–2.5)
ALT: 36 U/L — ABNORMAL HIGH (ref 6–29)
AST: 26 U/L (ref 10–30)
Albumin: 4.1 g/dL (ref 3.6–5.1)
Alkaline phosphatase (APISO): 133 U/L — ABNORMAL HIGH (ref 31–125)
BUN/Creatinine Ratio: 9 (calc) (ref 6–22)
BUN: 9 mg/dL (ref 7–25)
CO2: 31 mmol/L (ref 20–32)
Calcium: 9.5 mg/dL (ref 8.6–10.2)
Chloride: 97 mmol/L — ABNORMAL LOW (ref 98–110)
Creat: 1 mg/dL — ABNORMAL HIGH (ref 0.50–0.96)
Globulin: 3 g/dL (ref 1.9–3.7)
Glucose, Bld: 94 mg/dL (ref 65–99)
Potassium: 4.6 mmol/L (ref 3.5–5.3)
Sodium: 136 mmol/L (ref 135–146)
Total Bilirubin: 0.6 mg/dL (ref 0.2–1.2)
Total Protein: 7.1 g/dL (ref 6.1–8.1)
eGFR: 79 mL/min/1.73m2 (ref >=60)

## 2024-08-28 LAB — CBC WITH DIFFERENTIAL/PLATELET
Absolute Lymphocytes: 1778 {cells}/uL (ref 850–3900)
Absolute Monocytes: 458 {cells}/uL (ref 200–950)
Basophils Absolute: 52 {cells}/uL (ref 0–200)
Basophils Relative: 0.5 %
Eosinophils Absolute: 94 {cells}/uL (ref 15–500)
Eosinophils Relative: 0.9 %
HCT: 40.9 % (ref 35.0–45.0)
Hemoglobin: 13.3 g/dL (ref 11.7–15.5)
MCH: 29.8 pg (ref 27.0–33.0)
MCHC: 32.5 g/dL (ref 32.0–36.0)
MCV: 91.7 fL (ref 80.0–100.0)
MPV: 9.5 fL (ref 7.5–12.5)
Monocytes Relative: 4.4 %
Neutro Abs: 8018 {cells}/uL — ABNORMAL HIGH (ref 1500–7800)
Neutrophils Relative %: 77.1 %
Platelets: 396 Thousand/uL (ref 140–400)
RBC: 4.46 Million/uL (ref 3.80–5.10)
RDW: 13 % (ref 11.0–15.0)
Total Lymphocyte: 17.1 %
WBC: 10.4 Thousand/uL (ref 3.8–10.8)

## 2024-08-28 LAB — TSH: TSH: 3.88 m[IU]/L

## 2024-08-28 LAB — B12 AND FOLATE PANEL
Folate: 5.8 ng/mL
Vitamin B-12: 248 pg/mL (ref 200–1100)

## 2024-08-28 LAB — HEMOGLOBIN A1C
Hgb A1c MFr Bld: 5.3 % (ref <5.7)
Mean Plasma Glucose: 105 mg/dL
eAG (mmol/L): 5.8 mmol/L

## 2024-09-30 DEATH — deceased
